# Patient Record
Sex: Male | Born: 1950
Health system: Southern US, Community
[De-identification: ages and names within clinical notes are randomized; demographics above are authoritative.]

## PROBLEM LIST (undated history)

## (undated) DIAGNOSIS — Z973 Presence of spectacles and contact lenses: Secondary | ICD-10-CM

## (undated) DIAGNOSIS — H9190 Unspecified hearing loss, unspecified ear: Secondary | ICD-10-CM

## (undated) DIAGNOSIS — K635 Polyp of colon: Secondary | ICD-10-CM

## (undated) DIAGNOSIS — N400 Enlarged prostate without lower urinary tract symptoms: Secondary | ICD-10-CM

## (undated) HISTORY — DX: Benign prostatic hyperplasia without lower urinary tract symptoms: N40.0

## (undated) HISTORY — DX: Unspecified hearing loss, unspecified ear: H91.90

## (undated) HISTORY — PX: POLYPECTOMY: SHX149

## (undated) HISTORY — DX: Presence of spectacles and contact lenses: Z97.3

## (undated) HISTORY — DX: Polyp of colon: K63.5

## (undated) HISTORY — PX: COLONOSCOPY: SHX174

## (undated) HISTORY — PX: APPENDECTOMY: SHX54

---

## 2003-09-19 ENCOUNTER — Ambulatory Visit (HOSPITAL_COMMUNITY): Admission: RE | Admit: 2003-09-19 | Discharge: 2003-09-19 | Payer: Self-pay | Admitting: Gastroenterology

## 2003-09-19 ENCOUNTER — Encounter (INDEPENDENT_AMBULATORY_CARE_PROVIDER_SITE_OTHER): Payer: Self-pay | Admitting: Specialist

## 2004-12-25 ENCOUNTER — Ambulatory Visit: Payer: Self-pay | Admitting: Internal Medicine

## 2006-08-27 ENCOUNTER — Ambulatory Visit: Payer: Self-pay | Admitting: Internal Medicine

## 2006-09-02 ENCOUNTER — Ambulatory Visit: Payer: Self-pay | Admitting: Internal Medicine

## 2007-06-16 ENCOUNTER — Ambulatory Visit: Payer: Self-pay | Admitting: Internal Medicine

## 2007-07-25 DIAGNOSIS — R229 Localized swelling, mass and lump, unspecified: Secondary | ICD-10-CM

## 2007-08-25 ENCOUNTER — Ambulatory Visit: Payer: Self-pay | Admitting: Family Medicine

## 2008-01-13 ENCOUNTER — Ambulatory Visit: Payer: Self-pay | Admitting: Internal Medicine

## 2008-01-28 ENCOUNTER — Ambulatory Visit: Payer: Self-pay | Admitting: Internal Medicine

## 2008-01-28 DIAGNOSIS — Z860100 Personal history of colon polyps, unspecified: Secondary | ICD-10-CM | POA: Insufficient documentation

## 2008-01-28 DIAGNOSIS — N138 Other obstructive and reflux uropathy: Secondary | ICD-10-CM

## 2008-01-28 DIAGNOSIS — N401 Enlarged prostate with lower urinary tract symptoms: Secondary | ICD-10-CM

## 2008-01-29 LAB — CONVERTED CEMR LAB
HDL: 45 mg/dL (ref >39.0)
Triglycerides: 89 mg/dL (ref 0–149)
VLDL: 18 mg/dL (ref 0–40)

## 2008-02-23 ENCOUNTER — Ambulatory Visit: Payer: Self-pay | Admitting: Internal Medicine

## 2008-03-14 ENCOUNTER — Encounter: Payer: Self-pay | Admitting: Internal Medicine

## 2008-03-14 ENCOUNTER — Ambulatory Visit: Payer: Self-pay | Admitting: Internal Medicine

## 2009-06-20 ENCOUNTER — Ambulatory Visit: Payer: Self-pay | Admitting: Family Medicine

## 2009-06-20 DIAGNOSIS — M65839 Other synovitis and tenosynovitis, unspecified forearm: Secondary | ICD-10-CM

## 2009-06-20 DIAGNOSIS — M67919 Unspecified disorder of synovium and tendon, unspecified shoulder: Secondary | ICD-10-CM | POA: Insufficient documentation

## 2009-06-20 DIAGNOSIS — M719 Bursopathy, unspecified: Secondary | ICD-10-CM

## 2009-06-20 DIAGNOSIS — M65849 Other synovitis and tenosynovitis, unspecified hand: Secondary | ICD-10-CM

## 2009-07-11 ENCOUNTER — Ambulatory Visit: Payer: Self-pay | Admitting: Family Medicine

## 2009-07-11 DIAGNOSIS — M2550 Pain in unspecified joint: Secondary | ICD-10-CM | POA: Insufficient documentation

## 2009-07-13 LAB — CONVERTED CEMR LAB: Uric Acid, Serum: 6.1 mg/dL (ref 4.0–7.8)

## 2009-07-14 ENCOUNTER — Encounter: Payer: Self-pay | Admitting: Family Medicine

## 2010-01-25 ENCOUNTER — Ambulatory Visit: Payer: Self-pay | Admitting: Family Medicine

## 2010-07-02 ENCOUNTER — Ambulatory Visit: Payer: Self-pay | Admitting: Internal Medicine

## 2010-07-02 DIAGNOSIS — R5381 Other malaise: Secondary | ICD-10-CM

## 2010-07-02 DIAGNOSIS — R5383 Other fatigue: Secondary | ICD-10-CM

## 2010-09-17 ENCOUNTER — Ambulatory Visit: Payer: Self-pay | Admitting: Internal Medicine

## 2010-09-17 DIAGNOSIS — R079 Chest pain, unspecified: Secondary | ICD-10-CM | POA: Insufficient documentation

## 2010-11-08 ENCOUNTER — Ambulatory Visit: Payer: Self-pay | Admitting: Family Medicine

## 2010-11-08 DIAGNOSIS — K4090 Unilateral inguinal hernia, without obstruction or gangrene, not specified as recurrent: Secondary | ICD-10-CM | POA: Insufficient documentation

## 2010-11-08 LAB — CONVERTED CEMR LAB
Basophils Relative: 0.6 % (ref 0.0–3.0)
Bilirubin Urine: NEGATIVE
Eosinophils Relative: 3.8 % (ref 0.0–5.0)
HCT: 45.6 % (ref 39.0–52.0)
Ketones, urine, test strip: NEGATIVE
MCV: 94 fL (ref 78.0–100.0)
Monocytes Absolute: 0.5 10*3/uL (ref 0.1–1.0)
Monocytes Relative: 8.1 % (ref 3.0–12.0)
Neutrophils Relative %: 60.6 % (ref 43.0–77.0)
Protein, U semiquant: NEGATIVE
RBC: 4.85 M/uL (ref 4.22–5.81)
Urobilinogen, UA: 0.2
WBC: 6.6 10*3/uL (ref 4.5–10.5)
pH: 6

## 2010-11-09 ENCOUNTER — Encounter: Payer: Self-pay | Admitting: Family Medicine

## 2010-11-16 ENCOUNTER — Encounter: Admission: RE | Admit: 2010-11-16 | Discharge: 2010-11-16 | Payer: Self-pay | Admitting: General Surgery

## 2010-12-21 ENCOUNTER — Ambulatory Visit (HOSPITAL_COMMUNITY)
Admission: RE | Admit: 2010-12-21 | Discharge: 2010-12-21 | Payer: Self-pay | Source: Home / Self Care | Attending: General Surgery | Admitting: General Surgery

## 2010-12-21 HISTORY — PX: INGUINAL HERNIA REPAIR: SUR1180

## 2011-01-04 ENCOUNTER — Encounter: Payer: Self-pay | Admitting: Internal Medicine

## 2011-01-20 LAB — CONVERTED CEMR LAB
ALT: 28 units/L (ref 0–53)
Alkaline Phosphatase: 71 units/L (ref 39–117)
Basophils Relative: 0.7 % (ref 0.0–3.0)
Bilirubin, Direct: 0.2 mg/dL (ref 0.0–0.3)
Calcium: 9.1 mg/dL (ref 8.4–10.5)
Eosinophils Relative: 3 % (ref 0.0–5.0)
Glucose, Bld: 78 mg/dL (ref 70–99)
Lymphocytes Relative: 22 % (ref 12.0–46.0)
Monocytes Relative: 7.9 % (ref 3.0–12.0)
Neutrophils Relative %: 66.4 % (ref 43.0–77.0)
Phosphorus: 3.6 mg/dL (ref 2.3–4.6)
Potassium: 4.6 meq/L (ref 3.5–5.1)
RBC: 4.89 M/uL (ref 4.22–5.81)
Sodium: 143 meq/L (ref 135–145)
Total Protein: 6.8 g/dL (ref 6.0–8.3)
WBC: 10.4 10*3/uL (ref 4.5–10.5)

## 2011-01-22 NOTE — Assessment & Plan Note (Signed)
Summary: cough.congestion/rbh   Vital Signs:  Patient profile:   60 year old male Weight:      212 pounds O2 Sat:      98 % on Room air Temp:     97.9 degrees F oral Pulse rate:   88 / minute Pulse rhythm:   regular BP sitting:   114 / 70  (left arm) Cuff size:   large  Vitals Entered By: Sydell Axon LPN (January 25, 2010 3:54 PM)  O2 Flow:  Room air CC: Cough   History of Present Illness: Pt has cold for one month since the New Year, with cough that has still hung on. He has had cough every day since the New Year. He has no fever or chills, no headache but did initially, no ear pain now, had initially, rhinitis not now, no ST, but cough rarely minimal production basically clear, sometimes green. Some SOB with breathing depp, no N/V. During cold took Theraflu for a week. He has h/o asbestos exposure for 20 yrs but has no resp compromise when seen.  Problems Prior to Update: 1)  Arthralgia  (ICD-719.40) 2)  Rotator Cuff Syndrome, Left  (ICD-726.10) 3)  Other Tenosynovitis of Hand and Wrist  (ICD-727.05) 4)  Preventive Health Care  (ICD-V70.0) 5)  Colonic Polyps, Hx of  (ICD-V12.72) 6)  Colonic Polyps, Adenomatous, Hx of  (ICD-V12.72) 7)  Benign Prostatic Hypertrophy  (ICD-600.00) 8)  Symp Swell/mass/lump, Localized Superficial  (ICD-782.2)  Medications Prior to Update: 1)  None  Allergies: No Known Drug Allergies  Physical Exam  General:  Well-developed,well-nourished,in no acute distress; alert,appropriate and cooperative throughout examination, coughing occas when seen, minimally congested. Head:  Normocephalic and atraumatic without obvious abnormalities. No apparent alopecia or balding. Sinuses NT. Eyes:  Conjunctiva clear bilaterally.  Ears:  External ear exam shows no significant lesions or deformities.  Otoscopic examination reveals clear canals, tympanic membranes are intact bilaterally without bulging, retraction, inflammation or discharge. Hearing is grossly  normal bilaterally. Mildly decreased hearing acuity. Nose:  External nasal examination shows no deformity or inflammation. Nasal mucosa are pink and moist without lesions or exudates. Mouth:  Oral mucosa and oropharynx without lesions or exudates.  Teeth in good repair. Neck:  No deformities, masses, or tenderness noted. Lungs:  Normal respiratory effort, chest expands symmetrically. Lungs are clear to auscultation, no crackles or wheezes. Heart:  Normal rate and regular rhythm. S1 and S2 normal without gallop, murmur, click, rub or other extra sounds.   Impression & Recommendations:  Problem # 1:  BRONCHITIS- ACUTE (ICD-466.0) Assessment New  See instructions. Advair sample given for inflammation. If doesn't improve, consider Pulm consult for asbestos exposure. His updated medication list for this problem includes:    Zithromax Z-pak 250 Mg Tabs (Azithromycin) .Marland Kitchen... As dir    Tessalon 200 Mg Caps (Benzonatate) ..... One tab by mouth three times a day  Take antibiotics and other medications as directed. Encouraged to push clear liquids, get enough rest, and take acetaminophen as needed. To be seen in 5-7 days if no improvement, sooner if worse.  Complete Medication List: 1)  Zithromax Z-pak 250 Mg Tabs (Azithromycin) .... As dir 2)  Tessalon 200 Mg Caps (Benzonatate) .... One tab by mouth three times a day  Patient Instructions: 1)  Take Zithro 2)  Use Tessalon 3)  Take Guaifenesin by [facility] or [facility] and getting MUCOUS RELIEF EXPECTORANT (400mg ), take 11/2 tabs by mouth AM and NOON. 4)  Drink  lots of fluids anytime taking Guaifenesin.  5)  Take Tyl ES 2 tabs by mouth three times a day  6)  Use Advair two times a day for one week 7)  If no impr, RTC for poss asbestos eval. Prescriptions: TESSALON 200 MG CAPS (BENZONATATE) one tab by mouth three times a day  #40 x 0   Entered and Authorized by:   Shaune Leeks MD   Signed by:   Shaune Leeks MD on 01/25/2010   Method used:   [facility] to        [facility]  [address] (retail)       [address]       [place], [place]  [address]       Ph: [phone] or [phone]       Fax: [phone]   RxID:   [id number] ZITHROMAX Z-PAK 250 MG TABS (AZITHROMYCIN) as dir  #1 pak x 0   Entered and Authorized by:   Shaune Leeks MD   Signed by:   Shaune Leeks MD on 01/25/2010   Method used:   [facility]        [facility]  [address] (retail)       [address]       [place], [place]  [address]       Ph: [phone] or [phone]       Fax: [phone]   RxID:   [id number]   Prior Medications (reviewed today): None Current Allergies (reviewed today): No known allergies

## 2011-01-22 NOTE — Assessment & Plan Note (Signed)
Summary: 2:15 NO ENERGY/CLE   Vital Signs:  Patient profile:   60 year old male Weight:      206 pounds BMI:     28.04 Temp:     98.1 degrees F oral Pulse rate:   76 / minute Pulse rhythm:   regular BP sitting:   110 / 70  (left arm) Cuff size:   large  Vitals Entered By: Mervin Hack CMA Duncan Dull) (July 02, 2010 2:30 PM) CC: no energy   History of Present Illness: Having a difficult time over the past 2-3 weeks No energy will come home from work and just want to go to sleep Appetite is off---better than last week though some transient sore throat and had cold sore (this is a recurrent thing)  No cough No SOB No rash or sores now (did have briefly sore toe) No nausea or vomiting No diarrhea no otalgia    Allergies: No Known Drug Allergies  Past History:  Past medical, surgical, family and social histories (including risk factors) reviewed for relevance to current acute and chronic problems.  Past Medical History: Reviewed history from 01/28/2008 and no changes required. Benign prostatic hypertrophy Colonic polyps, hx of--adenomatous  Past Surgical History: Reviewed history from 01/13/2008 and no changes required. Appendectomy as child  Family History: Reviewed history from 01/13/2008 and no changes required. Dad died @76  had DM, not sure of cause of death Mom 62 now--has arthritis 4 brothers, 1 sister DM in 1 brother No CAD but 1 brother has valvular heart disease  No HTN No colon or prostate cancer (except maybe someone distant)  Social History: Reviewed history from 01/28/2008 and no changes required. Occupation: [employer]--various jobs Married--2 children Never Smoked Alcohol use-occ Regular exercise-no  Review of Systems       chronic tinnitus---some worsening hearing is not changed Doesn't note much weight loss--down 6# since February  Physical Exam  General:  alert and normal appearance.   Ears:  R ear normal and L ear normal.     Mouth:  no erythema, no exudates, and no lesions.   Neck:  supple, no masses, no thyromegaly, no carotid bruits, and no cervical lymphadenopathy.   Lungs:  normal respiratory effort, no intercostal retractions, no accessory muscle use, normal breath sounds, and no crackles.   Heart:  normal rate, regular rhythm, no murmur, and no gallop.  Coarse squeaky rub heard intermitently at various spots across precordium and even in right back Abdomen:  soft, non-tender, no masses, no hepatomegaly, and no splenomegaly.   Msk:  no joint tenderness and no joint swelling.   Extremities:  no edema Skin:  no rashes and no suspicious lesions.   Psych:  normally interactive, good eye contact, not anxious appearing, and not depressed appearing.     Impression & Recommendations:  Problem # 1:  FATIGUE (ICD-780.79) Assessment New  non specific may have low level infection but nothing clear cut will check labs observe   Has apparent rub this could go along with acute viral syndrome no signs of CHF or cardiac symptoms would just check CPK and sed rate, consider echo if abnormal  Orders: EKG w/ Interpretation (93000) TLB-Renal Function Panel (80069-RENAL) TLB-CBC Platelet - w/Differential (85025-CBCD) TLB-Hepatic/Liver Function Pnl (80076-HEPATIC) TLB-TSH (Thyroid Stimulating Hormone) (84443-TSH) Venipuncture (54098) TLB-CK Total Only(Creatine Kinase/CPK) (82550-CK) TLB-CK-MB (Creatine Kinase MB) (82553-CKMB) TLB-Sedimentation Rate (ESR) (85652-ESR)  Other Orders: TLB-PSA (Prostate Specific Antigen) (84153-PSA)  Patient Instructions: 1)  Please call for reevaluation in 2-3 weeks if not  feeling better 2)  Otherwise schediule a physical in about 1 year  Prior Medications: Current Allergies (reviewed today): No known allergies    EKG  Procedure date:  07/02/2010  Findings:      sinus @75  normal

## 2011-01-22 NOTE — Assessment & Plan Note (Signed)
Summary: SWOLLEN AREA ON CHEST   Vital Signs:  Patient profile:   60 year old male Weight:      208 pounds Temp:     98.2 degrees F oral Pulse rate:   76 / minute Pulse rhythm:   regular BP sitting:   110 / 70  (left arm) Cuff size:   large  Vitals Entered By: Sydell Axon LPN (September 17, 2010 2:50 PM) CC: Has a swollen area on left side of chest that is tender   History of Present Illness: Not having the fatigue as much now  Now has tender, sore mass over lateral left breast Goes back perhaps 2 months Progressively increasing soreness but not necessarily growing  No discharge from mass or nipple No apparent axillary nodes no fever  No injury in this area hasn't tried any meds for this Not much of a caffeine drinker  Allergies: No Known Drug Allergies  Past History:  Past medical, surgical, family and social histories (including risk factors) reviewed for relevance to current acute and chronic problems.  Past Medical History: Reviewed history from 01/28/2008 and no changes required. Benign prostatic hypertrophy Colonic polyps, hx of--adenomatous  Past Surgical History: Reviewed history from 01/13/2008 and no changes required. Appendectomy as child  Family History: Reviewed history from 01/13/2008 and no changes required. Dad died @76  had DM, not sure of cause of death Mom 29 now--has arthritis 4 brothers, 1 sister DM in 1 brother No CAD but 1 brother has valvular heart disease  No HTN No colon or prostate cancer (except maybe someone distant)  Social History: Reviewed history from 01/28/2008 and no changes required. Occupation: [employer]--various jobs Married--2 children Never Smoked Alcohol use-occ Regular exercise-no  Review of Systems       No cough  No SOB  Physical Exam  General:  alert and normal appearance.   Breasts:  ? slight fullness  ~4 o'clock in left breast no nipple abnormalities or discharge no inflammation slightly  tender over this area Axillary Nodes:  No palpable lymphadenopathy   Impression & Recommendations:  Problem # 1:  CHEST PAIN (ICD-786.50) Assessment New vague palpable area that could be cystic changes nothing worrisome but he is concerned about the continued tenderness  will try ibuprofen surgical eval if persists  Orders: T-2 View CXR (71020TC)  Patient Instructions: 1)  Please try ibuprofen 400-600mg  three times a day with food to see if that gets rid of the pain (take 2-3 of the over the counter 200mg  tabs) 2)  If the pain doesn't resolve in the next few weeks, call for referral to surgeon for reevaluation  Prior Medications (reviewed today): None Current Allergies (reviewed today): No known allergies

## 2011-01-22 NOTE — Assessment & Plan Note (Signed)
Summary: BACK PAIN/CLE   Vital Signs:  Patient profile:   60 year old male Weight:      212.25 pounds Temp:     97.7 degrees F oral Pulse rate:   88 / minute Pulse rhythm:   regular BP sitting:   118 / 78  (left arm) Cuff size:   large  Vitals Entered By: Selena Batten Dance CMA Duncan Dull) (November 08, 2010 8:34 AM) CC: Flank and groin pain   History of Present Illness: CC: L abd/back pain  2d h/o lower abd pain and L flank pain.  When starts, sudden sharp stabbing pain, has to lay flat on abdomen.  Pain does stay L groin area along inguinal canal, also with L lower back/flank pain.  Improved in fetal position immobile.  Worse when changing positions.  Severe pain episode lasts 30 min.  Feeling some discomfort now but nothing like acute episodes of pain.  No fevers/chills, no R sided abd pain, dysuria, urgency, no blood in stool or urine.  Appetite intact.  Nl bowel movements, passing gas fine.    Colonoscopy 2 years ago, normal.  + h/o L inguinal hernia.  Current Medications (verified): 1)  None  Allergies (verified): No Known Drug Allergies  Past History:  Past Medical History: Last updated: 01/28/2008 Benign prostatic hypertrophy Colonic polyps, hx of--adenomatous  Past Surgical History: Last updated: February 08, 2008 Appendectomy as child  Family History: Last updated: 2008/02/08 Dad died @76  had DM, not sure of cause of death Mom 37 now--has arthritis 4 brothers, 1 sister DM in 1 brother No CAD but 1 brother has valvular heart disease  No HTN No colon or prostate cancer (except maybe someone distant)  Social History: Last updated: 01/28/2008 Occupation: [employer]--various jobs Married--2 children Never Smoked Alcohol use-occ Regular exercise-no PMH-FH-SH reviewed for relevance  Review of Systems       per HPI  Physical Exam  General:  alert and normal appearance.  uncomfortable with getting on and off exam table but otherwise NAD.  nontoxic Lungs:  normal  respiratory effort, no intercostal retractions, no accessory muscle use, normal breath sounds, and no crackles.   Heart:  normal rate, regular rhythm, no murmur, and no gallop.  Abdomen:  soft, non-tender,  no hepatomegaly, and no splenomegaly.  no rebound or guarding.  + L inguinal mobile mass superficial to inguinal canal, not really reducible Genitalia:  Testes bilaterally descended without nodularity, tenderness or masses. No scrotal masses or lesions. No penis lesions or urethral discharge.  + L inguinal hernia Pulses:  2+ rad pulses Extremities:  no edema Skin:  Intact without suspicious lesions or rashes   Impression & Recommendations:  Problem # 1:  INGUINAL HERNIA, LEFT (ICD-550.90) healthy 59yo with episodes of L inguinal hernia pain, will refer for surgical eval.  doesn't look strangulated, ? incarcerated causing pain.  UA WNL x spgr >1.030, rec increased fluid.  check CBC.  Cr 06/2010 0.9.  Orders: Surgical Referral (Surgery) TLB-CBC Platelet - w/Differential (85025-CBCD)  Problem # 2:  SYMP SWELL/MASS/LUMP, LOCALIZED SUPERFICIAL (ICD-782.2) also describes L chest mass thought to be cystic changes, PCP had considered surgical eval.  pt desires to have #1 taken care of first.  Patient Instructions: 1)  Drink more water. 2)  We will refer you to the surgery clinic for evaluation.  Pass by Marion's office for appointment. 3)  Blood work today. 4)  Good to meet you today, call clinic with questions.   Orders Added: 1)  Est. Patient Level  III [id number] 2)  Surgical Referral [Surgery] 3)  TLB-CBC Platelet - w/Differential [85025-CBCD]    Prior Medications: None Current Allergies (reviewed today): No known allergies   Laboratory Results   Urine Tests  Date/Time Received: November 08, 2010 8:39 AM  Date/Time Reported: November 08, 2010 8:39 AM   Routine Urinalysis   Color: yellow Appearance: Clear Glucose: negative   (Normal Range: Negative) Bilirubin: negative    (Normal Range: Negative) Ketone: negative   (Normal Range: Negative) Spec. Gravity: >=1.030   (Normal Range: 1.003-1.035) Blood: negative   (Normal Range: Negative) pH: 6.0   (Normal Range: 5.0-8.0) Protein: negative   (Normal Range: Negative) Urobilinogen: 0.2   (Normal Range: 0-1) Nitrite: negative   (Normal Range: Negative) Leukocyte Esterace: negative   (Normal Range: Negative)       Appended Document: BACK PAIN/CLE please call patient and notify that if sharp pain episodes not resolving after 30 min, or if any fevers/chills, will need to go to ER prior to appt tomorrow.  o/w can wait until tomorrow.  Appended Document: BACK PAIN/CLE Patient notified and verbalized understanding

## 2011-01-24 NOTE — Consult Note (Signed)
Summary: [facility]   Imported By: Lanelle Bal 12/04/2010 14:00:23  _____________________________________________________________________  External Attachment:    Type:   Image     Comment:   External Document  Appended Document: [facility] Planning hernia repair will check chest lesion with ultrasound. If lipoma, may remove at the same time in the OR

## 2011-01-30 NOTE — Letter (Signed)
Summary: Dr Andrey Campanile Note  Dr Andrey Campanile Note   Imported By: Kassie Mends 01/25/2011 11:04:35  _____________________________________________________________________  External Attachment:    Type:   Image     Comment:   External Document  Appended Document: Dr Andrey Campanile Note doing well after left inguinal hernia repair

## 2011-02-06 ENCOUNTER — Encounter: Payer: Self-pay | Admitting: Internal Medicine

## 2011-02-19 NOTE — Letter (Signed)
Summary: [facility]   Imported By: Maryln Gottron 02/15/2011 15:44:25  _____________________________________________________________________  External Attachment:    Type:   Image     Comment:   External Document  Appended Document: [facility] post op left direct inguinal hernia repair in [place] Released from care

## 2011-03-04 LAB — BASIC METABOLIC PANEL
CO2: 30 mEq/L (ref 19–32)
Calcium: 9.1 mg/dL (ref 8.4–10.5)
Chloride: 105 mEq/L (ref 96–112)
Glucose, Bld: 119 mg/dL — ABNORMAL HIGH (ref 70–99)
Sodium: 140 mEq/L (ref 135–145)

## 2011-03-04 LAB — DIFFERENTIAL
Basophils Relative: 1 % (ref 0–1)
Eosinophils Absolute: 0.2 10*3/uL (ref 0.0–0.7)
Eosinophils Relative: 4 % (ref 0–5)
Lymphs Abs: 1.8 10*3/uL (ref 0.7–4.0)
Monocytes Absolute: 0.7 10*3/uL (ref 0.1–1.0)
Monocytes Relative: 10 % (ref 3–12)
Neutrophils Relative %: 59 % (ref 43–77)

## 2011-03-04 LAB — CBC
HCT: 45.8 % (ref 39.0–52.0)
Hemoglobin: 16 g/dL (ref 13.0–17.0)
MCH: 31.4 pg (ref 26.0–34.0)
MCHC: 34.9 g/dL (ref 30.0–36.0)
MCV: 89.8 fL (ref 78.0–100.0)

## 2011-05-10 NOTE — Op Note (Signed)
   NAME:  Louis Anderson, Louis Anderson                         ACCOUNT NO.:  [id number]   MEDICAL RECORD NO.:  [id number]                   PATIENT TYPE:  AMB   LOCATION:  ENDO                                 FACILITY:  [facility]   PHYSICIAN:  Graylin Shiver, M.D.                DATE OF BIRTH:  1951/07/05   DATE OF PROCEDURE:  09/19/2003  DATE OF DISCHARGE:                                 OPERATIVE REPORT   PROCEDURE PERFORMED:  Colonoscopy with biopsy.   INDICATIONS FOR PROCEDURE:  Screening.   Informed consent was obtained after explanation of the risks of bleeding,  infection, and perforation.   PREMEDICATIONS:  Fentanyl 75 mcg  IV, Versed 6 mg IV.   DESCRIPTION OF PROCEDURE:  With the patient in the left lateral decubitus  position, a rectal exam was performed and no masses were felt.  The Olympus  colonoscope was inserted into the rectum and advanced around the colon to  the cecum.  Cecal landmarks were identified.  In the cecum, there was a  small 3 mm sessile polyp removed with cold forceps.  In the ascending colon,  there was a small 4 mm sessile polyp removed with cold forceps.  The  transverse colon looked normal.  The descending colon, sigmoid and rectum  looked normal.  The patient tolerated the procedure well without  complications.   IMPRESSION:  Two small colon polyps.   PLAN:  The pathology will be checked.                                               Graylin Shiver, M.D.    SFG/MEDQ  D:  09/19/2003  T:  09/19/2003  Job:  [id number]   cc:   Christella Noa, M.D.  [address]  [place], [place] [address]  Fax: [phone]

## 2011-05-10 NOTE — Assessment & Plan Note (Signed)
[facility] OFFICE NOTE   NAME:Louis Anderson, Louis Anderson                      MRN:          [id number]  DATE:09/02/2006                            DOB:          05/01/1951    A 60 year old gentleman come in today for a wellness exam.  He has a history  of chronic tinnitus, also colonic polyps.  Last colonoscopy was 4-years ago.  He has also had a remote appendectomy.   REVIEW OF SYSTEMS:  __________ fairly unremarkable.  Does complain of some  occasional right lower back pain.   FAMILY HISTORY:  Positive for diabetes, otherwise non-contributory.   EXAMINATION:  GENERAL:  Revealed a healthy appearing male in no acute  distress.  VITAL SIGNS:  Blood pressure was normal.  HEENT:  Fundi, ear, nose and throat clear.  NECK:  No bruits or adenopathies.  CHEST:  Was clear.  CARDIOVASCULAR:  Normal heart sounds, no murmurs.  ABDOMEN: Benign.  No organomegaly.  EXTERNAL GENITALIA:  Normal.  EXTREMITIES:  Negative with full peripheral pulses.  RECTAL:  Prostate revealed +2 enlargement.  Hematest negative stool.  NEUROLOGICAL:  Negative.   IMPRESSION:  1. Chronic polyps.  2. Chronic tinnitus.  3. Episodic low back pain.   DISPOSITION:  He will take Advil as needed.  He does complain of some  decreased libido.  Some erectile dysfunction.  A  serum testosterone level  will be check.  He was also given a sample and a prescription for Viagra.  Exercises for his low back pain also dispensed.                                   Gordy Savers, MD   PFK/MedQ  DD:  09/02/2006  DT:  09/03/2006  Job #:  [id number]

## 2011-06-04 ENCOUNTER — Encounter (INDEPENDENT_AMBULATORY_CARE_PROVIDER_SITE_OTHER): Payer: Self-pay | Admitting: General Surgery

## 2011-07-18 ENCOUNTER — Encounter: Payer: Self-pay | Admitting: Family Medicine

## 2011-07-18 ENCOUNTER — Ambulatory Visit (INDEPENDENT_AMBULATORY_CARE_PROVIDER_SITE_OTHER): Payer: BC Managed Care – PPO | Admitting: Family Medicine

## 2011-07-18 VITALS — BP 112/76 | HR 76 | Temp 97.9°F | Wt 207.2 lb

## 2011-07-18 DIAGNOSIS — R109 Unspecified abdominal pain: Secondary | ICD-10-CM

## 2011-07-18 DIAGNOSIS — R350 Frequency of micturition: Secondary | ICD-10-CM

## 2011-07-18 DIAGNOSIS — R1030 Lower abdominal pain, unspecified: Secondary | ICD-10-CM

## 2011-07-18 LAB — POCT URINALYSIS DIPSTICK
Bilirubin, UA: NEGATIVE
Glucose, UA: NEGATIVE
Ketones, UA: NEGATIVE
pH, UA: 7.5

## 2011-07-18 MED ORDER — NAPROXEN 500 MG PO TABS: ORAL_TABLET | ORAL | Status: DC

## 2011-07-18 NOTE — Patient Instructions (Addendum)
Could be irritation after hernia repair from mesh. Try to treat with anti inflammatory twice daily for next several days with food, watch out for stomach upset. If not better, check with surgery about pain. If not improving or any worsening, or fevers, diarrhea, blood please return to be seen.

## 2011-07-18 NOTE — Progress Notes (Signed)
  Subjective:    Patient ID: Louis Anderson, male    DOB: March 27, 1951, 60 y.o.   MRN: [id number]  HPI CC: lower abd pain  Last several months, lower abd pain, worse when laying supine on bed.  More gassy than normal.  Pain not associated with foods.  Constant on left side, feels sharp occasionally sometimes feels left sided pressure at work.  Pain seems to be bothering him a bit more since after surgery, sharp inguinal hernia pain has not returned.  H/o back pain, occasional back pain but not changed with current abd pain.  Has been drinking plenty of water.  Urine stays dark.  Would like checked.  Denies dysuria, polyuria, urgency.  Nocturia x 3 times, weaker stream.    Had open LIH repair with mesh 11/2010.    Had colonoscopy a few years ago, told ok.  Denies fevers/chills, n/v/d/c, no blood in urine or stool.  No recent food changes.  Review of Systems Per HPI    Objective:   Physical Exam  Nursing note and vitals reviewed. Constitutional: He appears well-developed and well-nourished. No distress.  HENT:  Head: Normocephalic and atraumatic.  Mouth/Throat: Oropharynx is clear and moist. No oropharyngeal exudate.  Eyes: Conjunctivae and EOM are normal. Pupils are equal, round, and reactive to light. No scleral icterus.  Neck: Normal range of motion. Neck supple.  Cardiovascular: Normal rate, regular rhythm, normal heart sounds and intact distal pulses.   No murmur heard. Pulmonary/Chest: Effort normal and breath sounds normal. No respiratory distress. He has no wheezes. He has no rales.  Abdominal: Soft. Bowel sounds are normal. He exhibits no distension. There is no hepatosplenomegaly. There is tenderness (minimal lower abd). There is no rebound and no guarding. Hernia confirmed negative in the ventral area, confirmed negative in the right inguinal area and confirmed negative in the left inguinal area.  Musculoskeletal: He exhibits no edema.  Skin: Skin is warm and dry. No rash  noted.  Psychiatric: He has a normal mood and affect.          Assessment & Plan:

## 2011-07-18 NOTE — Assessment & Plan Note (Signed)
UA with trace blood, LE. Micro WNL. Exam benign today. ? Mesh causing discomfort.  No evidence of recurrent hernia on exam today. Trial of NSAIDs.  If not improved, rec check with surgery.

## 2011-08-14 ENCOUNTER — Encounter (INDEPENDENT_AMBULATORY_CARE_PROVIDER_SITE_OTHER): Payer: Self-pay | Admitting: General Surgery

## 2011-09-06 ENCOUNTER — Encounter (INDEPENDENT_AMBULATORY_CARE_PROVIDER_SITE_OTHER): Payer: Self-pay | Admitting: General Surgery

## 2011-09-27 ENCOUNTER — Encounter: Payer: Self-pay | Admitting: Internal Medicine

## 2011-10-03 ENCOUNTER — Ambulatory Visit (INDEPENDENT_AMBULATORY_CARE_PROVIDER_SITE_OTHER): Payer: BC Managed Care – PPO | Admitting: Internal Medicine

## 2011-10-03 ENCOUNTER — Encounter: Payer: Self-pay | Admitting: Internal Medicine

## 2011-10-03 VITALS — BP 121/65 | HR 65 | Temp 98.0°F | Ht 71.0 in | Wt 206.0 lb

## 2011-10-03 DIAGNOSIS — Z23 Encounter for immunization: Secondary | ICD-10-CM

## 2011-10-03 DIAGNOSIS — R1030 Lower abdominal pain, unspecified: Secondary | ICD-10-CM

## 2011-10-03 DIAGNOSIS — Z0000 Encounter for general adult medical examination without abnormal findings: Secondary | ICD-10-CM | POA: Insufficient documentation

## 2011-10-03 DIAGNOSIS — Z2911 Encounter for prophylactic immunotherapy for respiratory syncytial virus (RSV): Secondary | ICD-10-CM

## 2011-10-03 DIAGNOSIS — N400 Enlarged prostate without lower urinary tract symptoms: Secondary | ICD-10-CM

## 2011-10-03 DIAGNOSIS — R109 Unspecified abdominal pain: Secondary | ICD-10-CM

## 2011-10-03 MED ORDER — CIPROFLOXACIN HCL 250 MG PO TABS: 250.0000 mg | ORAL_TABLET | Freq: Two times a day (BID) | ORAL | Status: AC

## 2011-10-03 NOTE — Progress Notes (Signed)
Subjective:    Patient ID: Louis Anderson, male    DOB: February 06, 1951, 60 y.o.   MRN: [id number]  HPI Still has some discomfort in lower abdomen--since hernia repair Just pressure sensation---not overly bad Did have reeval from surgeon---no explanation  Has urinary frequency Nocturia x 3-4 No dysuria No hematuria  Has plantar fasciitis Got fancy shoes with arch support---just a week ago  No current outpatient prescriptions on file prior to visit.    No Known Allergies  Past Medical History  Diagnosis Date  . Bronchitis   . Colon polyp   . Wears glasses   . BPH (benign prostatic hypertrophy)     Past Surgical History  Procedure Date  . Appendectomy   . Inguinal hernia repair 12/21/10    left    Family History  Problem Relation Age of Onset  . Diabetes Father     History   Social History  . Marital Status: Married    Spouse Name: N/A    Number of Children: 2  . Years of Education: N/A   Occupational History  . [employer]    Social History Main Topics  . Smoking status: Never Smoker   . Smokeless tobacco: Never Used  . Alcohol Use: 3.5 oz/week    7 drink(s) per week     glass of wine a day  . Drug Use: No  . Sexually Active: Not on file   Other Topics Concern  . Not on file   Social History Narrative  . No narrative on file   Review of Systems  Constitutional: Negative for fatigue and unexpected weight change.       Wears seat belt  HENT: Positive for hearing loss and tinnitus. Negative for congestion, rhinorrhea and dental problem.        Regular with dentist  Eyes: Negative for visual disturbance.       No diplopia or unilateral vision loss Does have floaters---keeps up with ophtho  Respiratory: Negative for cough, chest tightness and shortness of breath.   Cardiovascular: Positive for palpitations. Negative for chest pain and leg swelling.       Rare brief racing heart--not overly fast  Gastrointestinal: Negative for nausea, vomiting,  abdominal pain, constipation and blood in stool.       Rare heartburn  Genitourinary: Positive for difficulty urinating. Negative for dysuria and hematuria.       Some ED--may be interested in meds Nocturia is bothersome  Musculoskeletal: Positive for back pain. Negative for joint swelling and arthralgias.       Chiropractor helps occ back pain  Skin: Negative for rash.       No suspicious lesions  Neurological: Negative for dizziness, syncope, weakness, light-headedness and numbness.       Occ headaches---right parietal esp with cold weather. Nothing new  Hematological: Negative for adenopathy. Does not bruise/bleed easily.  Psychiatric/Behavioral: Negative for sleep disturbance and dysphoric mood. The patient is not nervous/anxious.        Objective:   Physical Exam  Constitutional: He appears well-developed and well-nourished. No distress.  HENT:  Head: Normocephalic and atraumatic.  Right Ear: External ear normal.  Left Ear: External ear normal.  Mouth/Throat: Oropharynx is clear and moist. No oropharyngeal exudate.       TMs normal  Eyes: Conjunctivae and EOM are normal. Pupils are equal, round, and reactive to light.       Fundi benign  Neck: Normal range of motion. Neck supple.  Cardiovascular: Normal rate,  regular rhythm, normal heart sounds and intact distal pulses.  Exam reveals no gallop.   No murmur heard. Pulmonary/Chest: Effort normal and breath sounds normal. No respiratory distress. He has no wheezes. He has no rales.  Abdominal: Soft.       Slight vague lower abd sensitivity  Genitourinary:       Prostate not sig enlarged. Maintained sulcus but mild to moderate tenderness  Musculoskeletal: Normal range of motion. He exhibits no edema and no tenderness.  Lymphadenopathy:    He has no cervical adenopathy.  Skin: Skin is warm. No rash noted.  Psychiatric: He has a normal mood and affect. His behavior is normal. Judgment and thought content normal.            Assessment & Plan:

## 2011-10-03 NOTE — Assessment & Plan Note (Signed)
May be cause of his symptoms of nocturia No meds for this till after antibiotic course

## 2011-10-03 NOTE — Assessment & Plan Note (Signed)
Has persisted since hernia surgery Has mildly tender prostate Will treat with cipro for 1 month

## 2011-10-03 NOTE — Assessment & Plan Note (Signed)
Generally healthy Discussed fitness Tdap and zostavax today Prefers no flu vaccine No PSA due to current prostatitis

## 2011-10-04 LAB — CBC WITH DIFFERENTIAL/PLATELET
Eosinophils Relative: 3.4 % (ref 0.0–5.0)
HCT: 48.5 % (ref 39.0–52.0)
Hemoglobin: 16.3 g/dL (ref 13.0–17.0)
Lymphs Abs: 2.3 10*3/uL (ref 0.7–4.0)
MCV: 92.9 fl (ref 78.0–100.0)
Monocytes Absolute: 0.7 10*3/uL (ref 0.1–1.0)
Monocytes Relative: 7.7 % (ref 3.0–12.0)
Neutro Abs: 5.8 10*3/uL (ref 1.4–7.7)
Platelets: 238 10*3/uL (ref 150.0–400.0)
RDW: 13.9 % (ref 11.5–14.6)
WBC: 9.1 10*3/uL (ref 4.5–10.5)

## 2011-10-04 LAB — BASIC METABOLIC PANEL
BUN: 19 mg/dL (ref 6–23)
Chloride: 103 mEq/L (ref 96–112)
GFR: 76.72 mL/min (ref >60.00)
Glucose, Bld: 89 mg/dL (ref 70–99)
Potassium: 4.9 mEq/L (ref 3.5–5.1)
Sodium: 140 mEq/L (ref 135–145)

## 2011-10-04 LAB — HEPATIC FUNCTION PANEL
ALT: 25 U/L (ref 0–53)
AST: 24 U/L (ref 0–37)
Albumin: 4.7 g/dL (ref 3.5–5.2)
Total Bilirubin: 0.9 mg/dL (ref 0.3–1.2)

## 2012-01-26 IMAGING — CR DG CHEST 2V
2 series · 2 of 2 positions shown · non-contrast
Comparison: None.

CLINICAL DATA: Chest pain

CHEST - 2 VIEW

[view not recorded (1 of 2)]
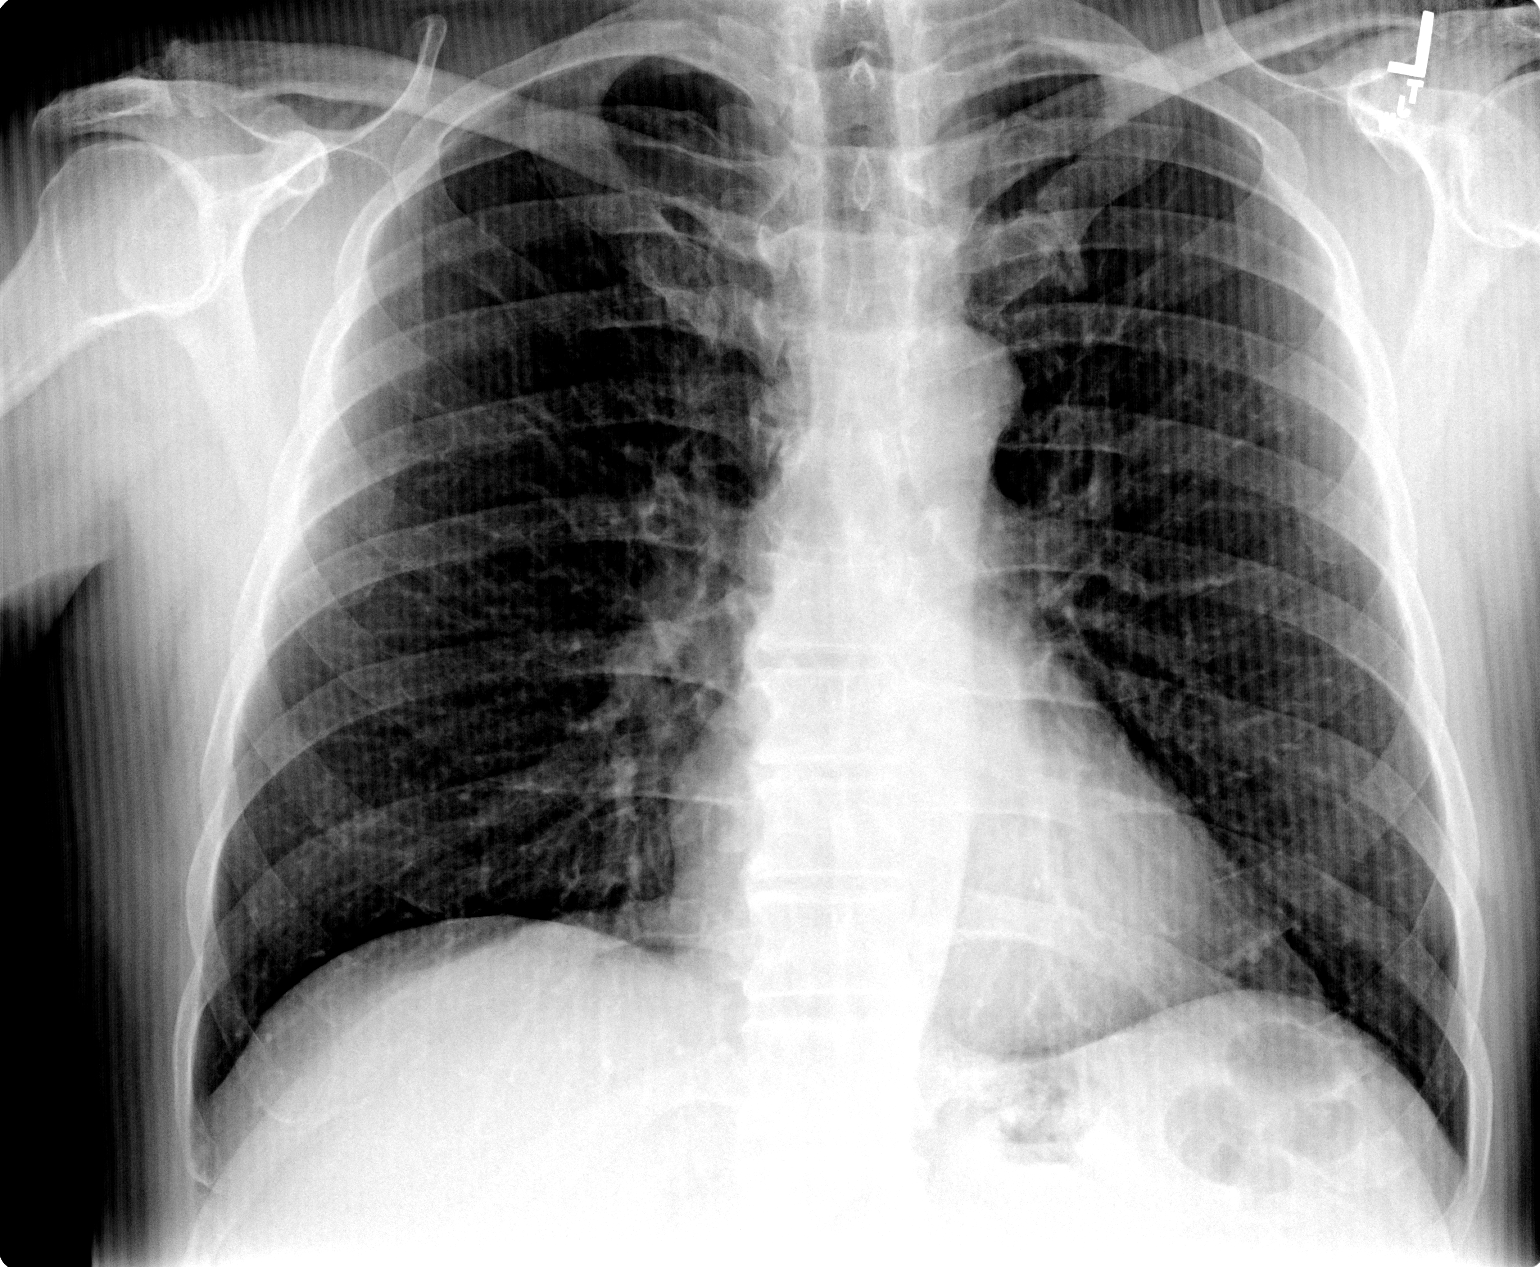

[view not recorded (2 of 2)]
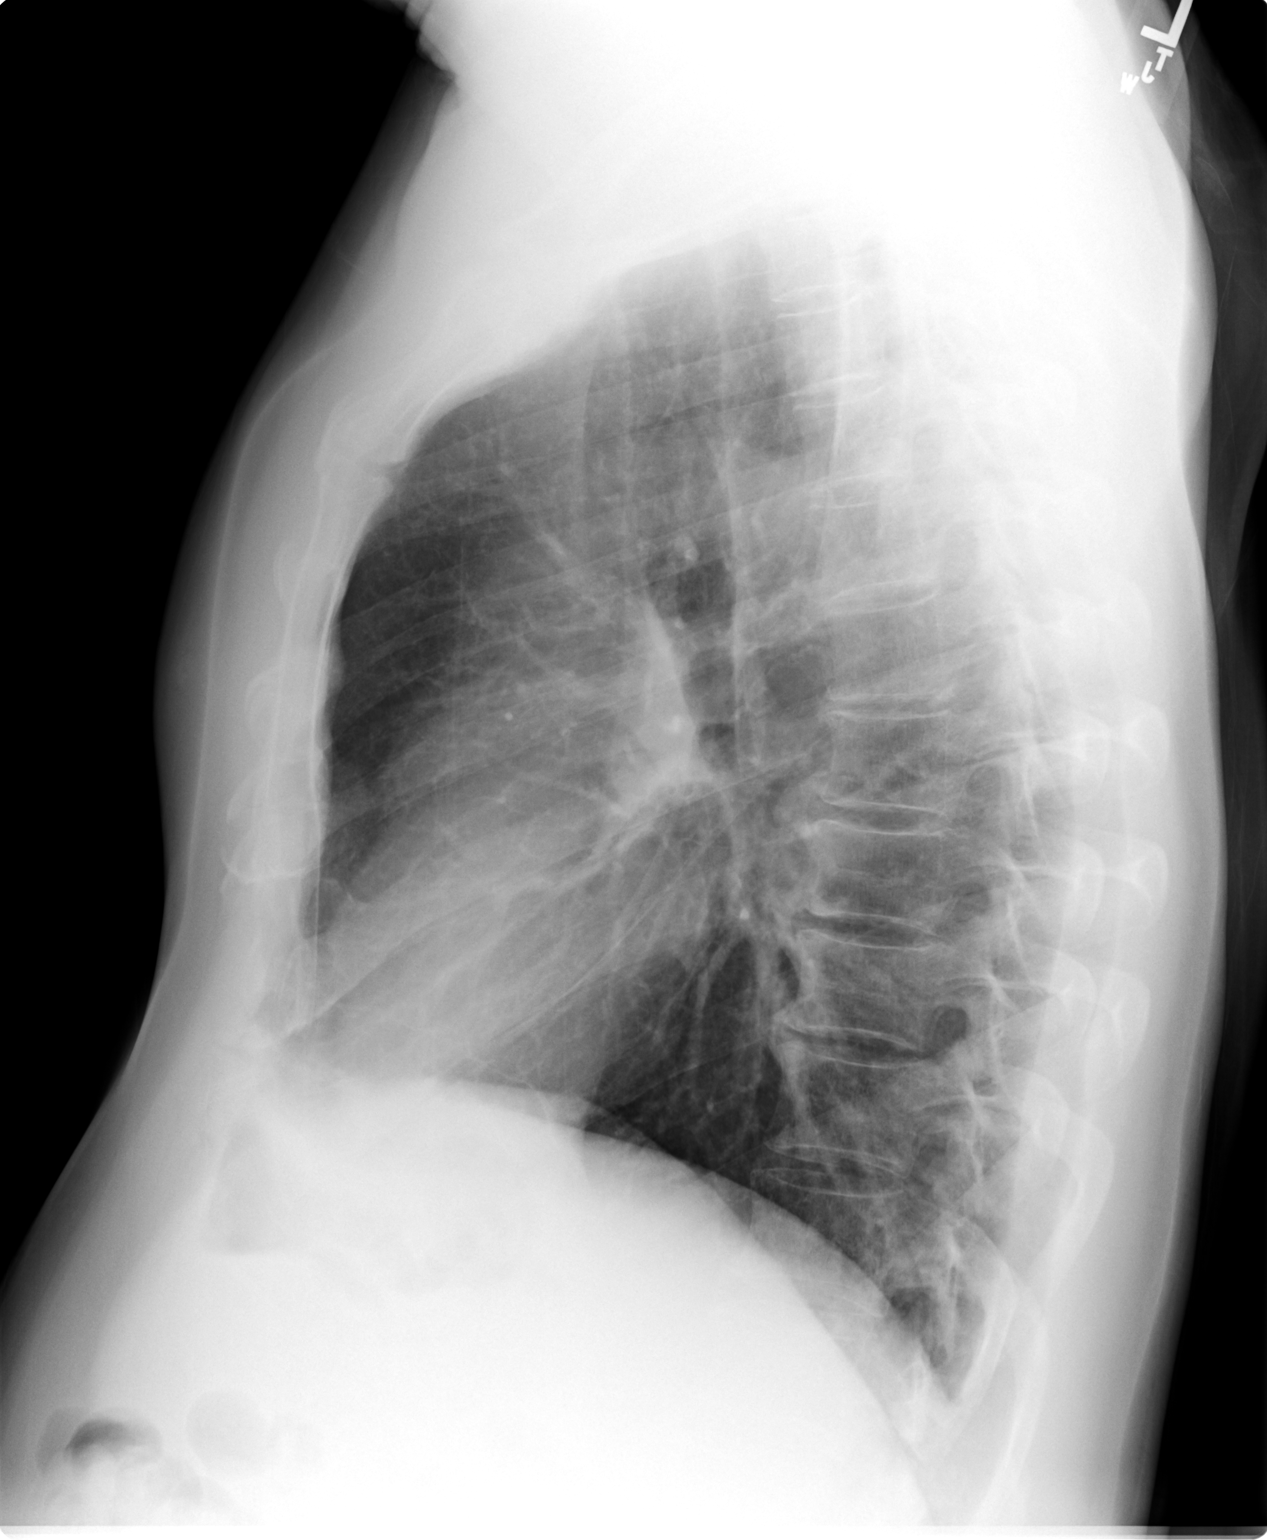

[2 of 2 positions shown; findings below may reference images not displayed]

FINDINGS: Heart and mediastinal contours.  Mild central airway
thickening and mild hyperaeration.  No active process.  Osseous
structures intact.
IMPRESSION: Mild chronic changes as above.  No active disease.

## 2012-09-11 ENCOUNTER — Other Ambulatory Visit: Payer: Self-pay

## 2012-10-02 ENCOUNTER — Encounter: Payer: BC Managed Care – PPO | Admitting: Internal Medicine

## 2012-10-12 ENCOUNTER — Telehealth: Payer: Self-pay | Admitting: Internal Medicine

## 2012-10-12 NOTE — Telephone Encounter (Signed)
Yes You can add on this Friday, or any others I haven't asked for the afternoon off (if there isn't anything else) Can do blood work then---I recommend only ~3-4 hours of fasting (so skip lunch but not breakfast if he comes in the afternoon----or a lite early lunch if 3-4PM appt)

## 2012-10-12 NOTE — Telephone Encounter (Signed)
Patient called to schedule an appointment for a cpx and the first available appointment for a cpx is on 04/26/13.  Patient said he can't wait that long to have his lab work done and would like to be seen sooner.  Can patient be seen sooner for a cpx?

## 2012-10-12 NOTE — Telephone Encounter (Signed)
I spoke with patient and he scheduled cpx appointment on 10/30/12 @ 2:00.  I told him he can eat breakfast,but to skip lunch.

## 2012-10-30 ENCOUNTER — Ambulatory Visit (INDEPENDENT_AMBULATORY_CARE_PROVIDER_SITE_OTHER): Payer: BC Managed Care – PPO | Admitting: Internal Medicine

## 2012-10-30 ENCOUNTER — Encounter: Payer: Self-pay | Admitting: Internal Medicine

## 2012-10-30 VITALS — BP 112/80 | HR 73 | Temp 98.0°F | Ht 71.5 in | Wt 207.0 lb

## 2012-10-30 DIAGNOSIS — Z0000 Encounter for general adult medical examination without abnormal findings: Secondary | ICD-10-CM

## 2012-10-30 DIAGNOSIS — N400 Enlarged prostate without lower urinary tract symptoms: Secondary | ICD-10-CM

## 2012-10-30 DIAGNOSIS — N529 Male erectile dysfunction, unspecified: Secondary | ICD-10-CM

## 2012-10-30 DIAGNOSIS — R002 Palpitations: Secondary | ICD-10-CM

## 2012-10-30 MED ORDER — TADALAFIL 20 MG PO TABS: 10.0000 mg | ORAL_TABLET | Freq: Every day | ORAL | Status: DC | PRN

## 2012-10-30 NOTE — Patient Instructions (Signed)
You may try melatonin 3-5mg  at bedtime--to see if that helps your sleep

## 2012-10-30 NOTE — Assessment & Plan Note (Signed)
Doesn't sound pathologic EKG is normal Will just check blood work

## 2012-10-30 NOTE — Progress Notes (Signed)
Subjective:    Patient ID: Louis Anderson, male    DOB: 1951-01-05, 61 y.o.   MRN: [id number]  HPI Doing okay Still has the low abdominal discomfort---burning sensation  No problems with function Doesn't remember if it was related to hernia surgery Discussed that this seems like ?nerve damage  Still working No changes socially  No current outpatient prescriptions on file prior to visit.    No Known Allergies  Past Medical History  Diagnosis Date  . Colon polyp   . BPH (benign prostatic hypertrophy)     Past Surgical History  Procedure Date  . Appendectomy   . Inguinal hernia repair 12/21/10    left    Family History  Problem Relation Age of Onset  . Diabetes Father     History   Social History  . Marital Status: Married    Spouse Name: N/A    Number of Children: 2  . Years of Education: N/A   Occupational History  . [employer]    Social History Main Topics  . Smoking status: Never Smoker   . Smokeless tobacco: Never Used  . Alcohol Use: 3.5 oz/week    7 drink(s) per week     Comment: glass of wine a day  . Drug Use: No  . Sexually Active: Not on file   Other Topics Concern  . Not on file   Social History Narrative  . No narrative on file   Review of Systems  Constitutional: Positive for fatigue. Negative for unexpected weight change.       Wears seat belt  HENT: Positive for hearing loss, sneezing and tinnitus. Negative for rhinorrhea, dental problem and postnasal drip.        Regular with dentist  Eyes: Negative for visual disturbance.       No diplopia or unilateral vision loss  Respiratory: Negative for cough, chest tightness and shortness of breath.   Cardiovascular: Positive for palpitations. Negative for chest pain and leg swelling.       Occ gets sense of racing heart--can occur at rest in bed. Not with exertion  No regular exercise  Gastrointestinal: Negative for nausea, vomiting, abdominal pain, constipation and blood in stool.         Only occasional heartburn  Genitourinary: Positive for urgency and frequency. Negative for difficulty urinating.       Stable urinary issues and nocturia Now more trouble with ED---ready to try meds  Musculoskeletal: Positive for back pain. Negative for joint swelling and arthralgias.  Skin: Negative for rash.       No suspicious lesions  Neurological: Positive for headaches. Negative for dizziness, syncope, weakness, light-headedness and numbness.       Intermittent occipital headaches if he gets cold--goes back at least 20 years  Hematological: Negative for adenopathy. Does not bruise/bleed easily.  Psychiatric/Behavioral: Positive for sleep disturbance. Negative for dysphoric mood. The patient is not nervous/anxious.        Has restless nights at times--occ daytime somnolence       Objective:   Physical Exam  Constitutional: He is oriented to person, place, and time. He appears well-developed and well-nourished. No distress.  HENT:  Head: Normocephalic and atraumatic.  Right Ear: External ear normal.  Left Ear: External ear normal.  Mouth/Throat: Oropharynx is clear and moist. No oropharyngeal exudate.  Eyes: Conjunctivae normal and EOM are normal. Pupils are equal, round, and reactive to light.  Neck: Normal range of motion. Neck supple. No thyromegaly present.  Cardiovascular: Normal rate, regular rhythm, normal heart sounds and intact distal pulses.  Exam reveals no gallop.   No murmur heard. Pulmonary/Chest: Effort normal and breath sounds normal. No respiratory distress. He has no wheezes. He has no rales.  Abdominal: Soft. There is no tenderness.  Musculoskeletal: He exhibits no edema and no tenderness.  Lymphadenopathy:    He has no cervical adenopathy.  Neurological: He is alert and oriented to person, place, and time.  Skin: No rash noted. No erythema.  Psychiatric: He has a normal mood and affect. His behavior is normal. Thought content normal.           Assessment & Plan:

## 2012-10-30 NOTE — Assessment & Plan Note (Signed)
Seems healthy Discussed fitness though Still prefers no flu shot Will check PSA after discussion

## 2012-10-30 NOTE — Assessment & Plan Note (Signed)
Mild symptoms Will hold off on Rx 

## 2012-10-30 NOTE — Assessment & Plan Note (Signed)
More bothersome Will try Rx

## 2012-10-31 LAB — BASIC METABOLIC PANEL
BUN: 14 mg/dL (ref 6–23)
CO2: 26 mEq/L (ref 19–32)
Chloride: 103 mEq/L (ref 96–112)
Glucose, Bld: 77 mg/dL (ref 70–99)
Potassium: 4.6 mEq/L (ref 3.5–5.3)
Sodium: 142 mEq/L (ref 135–145)

## 2012-10-31 LAB — HEPATIC FUNCTION PANEL
ALT: 18 U/L (ref 0–53)
AST: 17 U/L (ref 0–37)
Bilirubin, Direct: 0.2 mg/dL (ref 0.0–0.3)
Total Protein: 6.3 g/dL (ref 6.0–8.3)

## 2012-10-31 LAB — CBC WITH DIFFERENTIAL/PLATELET
Basophils Absolute: 0 10*3/uL (ref 0.0–0.1)
Basophils Relative: 0 % (ref 0–1)
Eosinophils Absolute: 0.2 10*3/uL (ref 0.0–0.7)
Eosinophils Relative: 3 % (ref 0–5)
HCT: 45.3 % (ref 39.0–52.0)
Lymphocytes Relative: 27 % (ref 12–46)
MCH: 30.9 pg (ref 26.0–34.0)
MCHC: 35.3 g/dL (ref 30.0–36.0)
MCV: 87.5 fL (ref 78.0–100.0)
Monocytes Absolute: 0.7 10*3/uL (ref 0.1–1.0)
Platelets: 238 10*3/uL (ref 150–400)
RDW: 13.6 % (ref 11.5–15.5)

## 2012-10-31 LAB — LIPID PANEL
Cholesterol: 175 mg/dL (ref 0–200)
Triglycerides: 76 mg/dL (ref <150)

## 2012-11-03 ENCOUNTER — Encounter: Payer: Self-pay | Admitting: *Deleted

## 2013-12-08 ENCOUNTER — Encounter: Payer: Self-pay | Admitting: Internal Medicine

## 2013-12-08 ENCOUNTER — Ambulatory Visit (INDEPENDENT_AMBULATORY_CARE_PROVIDER_SITE_OTHER): Payer: BC Managed Care – PPO | Admitting: Internal Medicine

## 2013-12-08 ENCOUNTER — Encounter: Payer: Self-pay | Admitting: *Deleted

## 2013-12-08 VITALS — BP 140/80 | HR 103 | Temp 102.1°F | Wt 211.0 lb

## 2013-12-08 DIAGNOSIS — J111 Influenza due to unidentified influenza virus with other respiratory manifestations: Secondary | ICD-10-CM | POA: Insufficient documentation

## 2013-12-08 NOTE — Progress Notes (Signed)
   Subjective:    Patient ID: Louis Anderson, male    DOB: September 25, 1951, 62 y.o.   MRN: [id number]  HPI Started with cold symptoms 2 days ago Then that night got really bad Mainly has sore throat Chills, fever Slight cough  No SOB No body aches Some left ear discomfort  Using aleve-- not clearly helpful Chloraseptic made his throat worse  Current Outpatient Prescriptions on File Prior to Visit  Medication Sig Dispense Refill  . tadalafil (CIALIS) 20 MG tablet Take 0.5-1 tablets (10-20 mg total) by mouth daily as needed for erectile dysfunction.  6 tablet  5   No current facility-administered medications on file prior to visit.    No Known Allergies  Past Medical History  Diagnosis Date  . Colon polyp   . BPH (benign prostatic hypertrophy)     Past Surgical History  Procedure Laterality Date  . Appendectomy    . Inguinal hernia repair  12/21/10    left    Family History  Problem Relation Age of Onset  . Diabetes Father     History   Social History  . Marital Status: Married    Spouse Name: N/A    Number of Children: 2  . Years of Education: N/A   Occupational History  . [employer]    Social History Main Topics  . Smoking status: Never Smoker   . Smokeless tobacco: Never Used  . Alcohol Use: 3.5 oz/week    7 drink(s) per week     Comment: glass of wine a day  . Drug Use: No  . Sexual Activity: Not on file   Other Topics Concern  . Not on file   Social History Narrative  . No narrative on file   Review of Systems No rash No vomiting or diarrhea Appetite is off some--due to sore throat    Objective:   Physical Exam  Constitutional: He appears well-developed and well-nourished.  Looks uncomfortable but no distress  HENT:  Mild nasal congestion TMs normal Slight pharyngeal injection without exudates  Neck: Normal range of motion. Neck supple.  Pulmonary/Chest: Effort normal and breath sounds normal. No respiratory distress. He has no  wheezes. He has no rales.  Lymphadenopathy:    He has no cervical adenopathy.  Skin: No rash noted.          Assessment & Plan:

## 2013-12-08 NOTE — Patient Instructions (Signed)
Please continue the aleve 2 tabs twice a day Try honey for your throat  Call back to set up a physical in 4-6 months

## 2013-12-08 NOTE — Assessment & Plan Note (Signed)
Clearly has the flu Too late to consider antivirals Discussed supportive care OOW probably till the 22nd

## 2014-08-31 ENCOUNTER — Encounter: Payer: Self-pay | Admitting: Internal Medicine

## 2014-12-30 ENCOUNTER — Ambulatory Visit (INDEPENDENT_AMBULATORY_CARE_PROVIDER_SITE_OTHER): Payer: Commercial Managed Care - HMO | Admitting: Internal Medicine

## 2014-12-30 ENCOUNTER — Encounter: Payer: Self-pay | Admitting: Internal Medicine

## 2014-12-30 VITALS — BP 120/80 | HR 71 | Temp 97.9°F | Ht 72.0 in | Wt 213.0 lb

## 2014-12-30 DIAGNOSIS — R002 Palpitations: Secondary | ICD-10-CM | POA: Insufficient documentation

## 2014-12-30 DIAGNOSIS — Z7189 Other specified counseling: Secondary | ICD-10-CM | POA: Diagnosis not present

## 2014-12-30 DIAGNOSIS — N400 Enlarged prostate without lower urinary tract symptoms: Secondary | ICD-10-CM

## 2014-12-30 DIAGNOSIS — Z23 Encounter for immunization: Secondary | ICD-10-CM | POA: Diagnosis not present

## 2014-12-30 DIAGNOSIS — Z0000 Encounter for general adult medical examination without abnormal findings: Secondary | ICD-10-CM | POA: Diagnosis not present

## 2014-12-30 LAB — CBC WITH DIFFERENTIAL/PLATELET
BASOS ABS: 0 10*3/uL (ref 0.0–0.1)
BASOS PCT: 0.7 % (ref 0.0–3.0)
EOS PCT: 3.8 % (ref 0.0–5.0)
Eosinophils Absolute: 0.3 10*3/uL (ref 0.0–0.7)
HCT: 47.6 % (ref 39.0–52.0)
HEMOGLOBIN: 15.7 g/dL (ref 13.0–17.0)
Lymphocytes Relative: 24.7 % (ref 12.0–46.0)
Lymphs Abs: 1.7 10*3/uL (ref 0.7–4.0)
MCHC: 32.9 g/dL (ref 30.0–36.0)
MCV: 93.6 fl (ref 78.0–100.0)
MONO ABS: 0.6 10*3/uL (ref 0.1–1.0)
Monocytes Relative: 8.1 % (ref 3.0–12.0)
NEUTROS ABS: 4.4 10*3/uL (ref 1.4–7.7)
Neutrophils Relative %: 62.7 % (ref 43.0–77.0)
Platelets: 235 10*3/uL (ref 150.0–400.0)
RBC: 5.09 Mil/uL (ref 4.22–5.81)
RDW: 13.7 % (ref 11.5–15.5)
WBC: 7.1 10*3/uL (ref 4.0–10.5)

## 2014-12-30 LAB — COMPREHENSIVE METABOLIC PANEL
ALBUMIN: 4.2 g/dL (ref 3.5–5.2)
ALT: 27 U/L (ref 0–53)
AST: 19 U/L (ref 0–37)
Alkaline Phosphatase: 56 U/L (ref 39–117)
BUN: 18 mg/dL (ref 6–23)
CO2: 29 mEq/L (ref 19–32)
CREATININE: 0.9 mg/dL (ref 0.4–1.5)
Calcium: 8.9 mg/dL (ref 8.4–10.5)
Chloride: 106 mEq/L (ref 96–112)
GFR: 87.48 mL/min (ref >60.00)
GLUCOSE: 103 mg/dL — AB (ref 70–99)
Potassium: 4.4 mEq/L (ref 3.5–5.1)
Sodium: 141 mEq/L (ref 135–145)
TOTAL PROTEIN: 6.7 g/dL (ref 6.0–8.3)
Total Bilirubin: 1 mg/dL (ref 0.2–1.2)

## 2014-12-30 LAB — PSA: PSA: 1.49 ng/mL (ref 0.10–4.00)

## 2014-12-30 LAB — LIPID PANEL
Cholesterol: 200 mg/dL (ref 0–200)
HDL: 51.1 mg/dL (ref >39.00)
LDL Cholesterol: 132 mg/dL — ABNORMAL HIGH (ref 0–99)
NonHDL: 148.9
Total CHOL/HDL Ratio: 4
Triglycerides: 83 mg/dL (ref 0.0–149.0)
VLDL: 16.6 mg/dL (ref 0.0–40.0)

## 2014-12-30 LAB — T4, FREE: FREE T4: 0.7 ng/dL (ref 0.60–1.60)

## 2014-12-30 NOTE — Progress Notes (Signed)
Subjective:    Patient ID: Louis Anderson, male    DOB: May 18, 1951, 64 y.o.   MRN: [id number]  HPI Here for physical and Medicare wellness Reviewed form and advanced directives No falls  No depression or anhedonia Reviewed other physicians Hearing is not good---did try hearing aide and wasn't happy with it Vision is fine Independent with instrumental ADLs 1-2 drinks of wine daily No tobacco No apparent cognitive problems  Only concern is some swelling in posterior and groin Notices it when he showers Keeping him from going back on his bicycle (hasn't been cycling)--can feel it when he is working  He did retire Has lots of chores for family that has kept him busy Does go in occasionally to prior job  Still has urinary frequency Nocturia x 3-4 Fairly good stream--but dribbling at the end  No current outpatient prescriptions on file prior to visit.   No current facility-administered medications on file prior to visit.    No Known Allergies  Past Medical History  Diagnosis Date  . Colon polyp   . BPH (benign prostatic hypertrophy)     Past Surgical History  Procedure Laterality Date  . Appendectomy    . Inguinal hernia repair  12/21/10    left    Family History  Problem Relation Age of Onset  . Diabetes Father     History   Social History  . Marital Status: Married    Spouse Name: N/A    Number of Children: 2  . Years of Education: N/A   Occupational History  . [employer]     Retired   Social History Main Topics  . Smoking status: Never Smoker   . Smokeless tobacco: Never Used  . Alcohol Use: 3.5 oz/week    7 drink(s) per week     Comment: glass of wine a day  . Drug Use: No  . Sexual Activity: Not on file   Other Topics Concern  . Not on file   Social History Narrative   No living will or health care POA.   Would want wife to make decisions   Would accept resuscitation but no prolonged ventilation   No tube feeds if cognitively  unaware   Review of Systems  Constitutional: Negative for fatigue and unexpected weight change.       Wears seat belt  HENT: Positive for hearing loss and tinnitus. Negative for dental problem.        Keeps up with dentist  Eyes: Negative for visual disturbance.       No diplopia or unilateral vision loss  Respiratory: Negative for cough, chest tightness and shortness of breath.   Cardiovascular: Positive for palpitations. Negative for chest pain and leg swelling.       Rare palpitation at rest  Gastrointestinal: Positive for constipation. Negative for nausea, vomiting, abdominal pain and blood in stool.  Endocrine: Negative for polydipsia and polyuria.  Genitourinary: Positive for frequency and difficulty urinating.       No sex---no problem  Musculoskeletal: Negative for back pain and joint swelling.       Left elbow pain at times---sleeve helps  Skin: Negative for rash.       No suspicious lesions  Allergic/Immunologic: Negative for environmental allergies and immunocompromised state.  Neurological: Positive for headaches. Negative for dizziness, syncope, weakness, light-headedness and numbness.       Occ occipital headache in the cold weather  Hematological: Negative for adenopathy. Does not bruise/bleed easily.  Psychiatric/Behavioral:  Positive for sleep disturbance. Negative for dysphoric mood. The patient is not nervous/anxious.        Some trouble getting back to sleep after nocturia       Objective:   Physical Exam  Constitutional: He is oriented to person, place, and time. He appears well-developed and well-nourished. No distress.  HENT:  Head: Normocephalic and atraumatic.  Right Ear: External ear normal.  Left Ear: External ear normal.  Mouth/Throat: Oropharynx is clear and moist. No oropharyngeal exudate.  Eyes: Conjunctivae and EOM are normal. Pupils are equal, round, and reactive to light.  Neck: Normal range of motion. Neck supple. No thyromegaly present.    Cardiovascular: Normal rate, regular rhythm, normal heart sounds and intact distal pulses.  Exam reveals no gallop.   No murmur heard. Pulmonary/Chest: Effort normal and breath sounds normal. No respiratory distress. He has no wheezes. He has no rales.  Abdominal: Soft. There is no tenderness.  Genitourinary:  Scrotum normal Slight extra fold of left buttock--no mass or hernia  Musculoskeletal: He exhibits no edema or tenderness.  Lymphadenopathy:    He has no cervical adenopathy.  Neurological: He is alert and oriented to person, place, and time.  President-- "Quay [place]row, [place]" [phone] [id number] Recall 3/3  Skin: No rash noted. No erythema.  Psychiatric: He has a normal mood and affect. His behavior is normal.          Assessment & Plan:

## 2014-12-30 NOTE — Patient Instructions (Signed)
DASH Eating Plan °DASH stands for "Dietary Approaches to Stop Hypertension." The DASH eating plan is a healthy eating plan that has been shown to reduce high blood pressure (hypertension). Additional health benefits may include reducing the risk of type 2 diabetes mellitus, heart disease, and stroke. The DASH eating plan may also help with weight loss. °WHAT DO I NEED TO KNOW ABOUT THE DASH EATING PLAN? °For the DASH eating plan, you will follow these general guidelines: °· Choose foods with a percent daily value for sodium of less than 5% (as listed on the food label). °· Use salt-free seasonings or herbs instead of table salt or sea salt. °· Check with your health care provider or pharmacist before using salt substitutes. °· Eat lower-sodium products, often labeled as "lower sodium" or "no salt added." °· Eat fresh foods. °· Eat more vegetables, fruits, and low-fat dairy products. °· Choose whole grains. Look for the word "whole" as the first word in the ingredient list. °· Choose fish and skinless chicken or turkey more often than red meat. Limit fish, poultry, and meat to 6 oz (170 g) each day. °· Limit sweets, desserts, sugars, and sugary drinks. °· Choose heart-healthy fats. °· Limit cheese to 1 oz (28 g) per day. °· Eat more home-cooked food and less restaurant, buffet, and fast food. °· Limit fried foods. °· Cook foods using methods other than frying. °· Limit canned vegetables. If you do use them, rinse them well to decrease the sodium. °· When eating at a restaurant, ask that your food be prepared with less salt, or no salt if possible. °WHAT FOODS CAN I EAT? °Seek help from a dietitian for individual calorie needs. °Grains °Whole grain or whole wheat bread. Brown rice. Whole grain or whole wheat pasta. Quinoa, bulgur, and whole grain cereals. Low-sodium cereals. Corn or whole wheat flour tortillas. Whole grain cornbread. Whole grain crackers. Low-sodium crackers. °Vegetables °Fresh or frozen vegetables  (raw, steamed, roasted, or grilled). Low-sodium or reduced-sodium tomato and vegetable juices. Low-sodium or reduced-sodium tomato sauce and paste. Low-sodium or reduced-sodium canned vegetables.  °Fruits °All fresh, canned (in natural juice), or frozen fruits. °Meat and Other Protein Products °Ground beef (85% or leaner), grass-fed beef, or beef trimmed of fat. Skinless chicken or turkey. Ground chicken or turkey. Pork trimmed of fat. All fish and seafood. Eggs. Dried beans, peas, or lentils. Unsalted nuts and seeds. Unsalted canned beans. °Dairy °Low-fat dairy products, such as skim or 1% milk, 2% or reduced-fat cheeses, low-fat ricotta or cottage cheese, or plain low-fat yogurt. Low-sodium or reduced-sodium cheeses. °Fats and Oils °Tub margarines without trans fats. Light or reduced-fat mayonnaise and salad dressings (reduced sodium). Avocado. Safflower, olive, or canola oils. Natural peanut or almond butter. °Other °Unsalted popcorn and pretzels. °The items listed above may not be a complete list of recommended foods or beverages. Contact your dietitian for more options. °WHAT FOODS ARE NOT RECOMMENDED? °Grains °White bread. White pasta. White rice. Refined cornbread. Bagels and croissants. Crackers that contain trans fat. °Vegetables °Creamed or fried vegetables. Vegetables in a cheese sauce. Regular canned vegetables. Regular canned tomato sauce and paste. Regular tomato and vegetable juices. °Fruits °Dried fruits. Canned fruit in light or heavy syrup. Fruit juice. °Meat and Other Protein Products °Fatty cuts of meat. Ribs, chicken wings, bacon, sausage, bologna, salami, chitterlings, fatback, hot dogs, bratwurst, and packaged luncheon meats. Salted nuts and seeds. Canned beans with salt. °Dairy °Whole or 2% milk, cream, half-and-half, and cream cheese. Whole-fat or sweetened yogurt. Full-fat   cheeses or blue cheese. Nondairy creamers and whipped toppings. Processed cheese, cheese spreads, or cheese  curds. °Condiments °Onion and garlic salt, seasoned salt, table salt, and sea salt. Canned and packaged gravies. Worcestershire sauce. Tartar sauce. Barbecue sauce. Teriyaki sauce. Soy sauce, including reduced sodium. Steak sauce. Fish sauce. Oyster sauce. Cocktail sauce. Horseradish. Ketchup and mustard. Meat flavorings and tenderizers. Bouillon cubes. Hot sauce. Tabasco sauce. Marinades. Taco seasonings. Relishes. °Fats and Oils °Butter, stick margarine, lard, shortening, ghee, and bacon fat. Coconut, palm kernel, or palm oils. Regular salad dressings. °Other °Pickles and olives. Salted popcorn and pretzels. °The items listed above may not be a complete list of foods and beverages to avoid. Contact your dietitian for more information. °WHERE CAN I FIND MORE INFORMATION? °National Heart, [employer], [employer] Institute: www.nhlbi.nih.gov/health/health-topics/topics/dash/ °Document Released: 11/28/2011 Document Revised: 04/25/2014 Document Reviewed: 10/13/2013 °[employer]® Patient Information ©2015 [employer]. This information is not intended to replace advice given to you by your health care provider. Make sure you discuss any questions you have with your health care provider. ° °

## 2014-12-30 NOTE — Assessment & Plan Note (Signed)
Just at rest Doesn't sound pathologic Will just check labs

## 2014-12-30 NOTE — Assessment & Plan Note (Signed)
Ongoing symptoms Not ready for meds If worsens, will try tamsulosin

## 2014-12-30 NOTE — Assessment & Plan Note (Signed)
Fairly healthy but needs to work on fitness Will check PSA after discussion Colonoscopy due 2016-2019 (was told 7-10 years)--will hold off for now Will take prevnar but still doesn't want flu shot

## 2014-12-30 NOTE — Progress Notes (Signed)
Pre visit review using our clinic review tool, if applicable. No additional management support is needed unless otherwise documented below in the visit note. 

## 2014-12-30 NOTE — Addendum Note (Signed)
Addended by: Despina Hidden on: 12/30/2014 02:27 PM   Modules accepted: Orders

## 2014-12-30 NOTE — Assessment & Plan Note (Signed)
See social history Blank forms given 

## 2015-01-02 ENCOUNTER — Encounter: Payer: Self-pay | Admitting: *Deleted

## 2015-02-07 ENCOUNTER — Encounter: Payer: Self-pay | Admitting: Internal Medicine

## 2015-07-18 DIAGNOSIS — H43813 Vitreous degeneration, bilateral: Secondary | ICD-10-CM | POA: Diagnosis not present

## 2015-07-18 DIAGNOSIS — H5213 Myopia, bilateral: Secondary | ICD-10-CM | POA: Diagnosis not present

## 2015-07-27 ENCOUNTER — Ambulatory Visit (INDEPENDENT_AMBULATORY_CARE_PROVIDER_SITE_OTHER): Payer: Commercial Managed Care - HMO | Admitting: Internal Medicine

## 2015-07-27 ENCOUNTER — Encounter: Payer: Self-pay | Admitting: Internal Medicine

## 2015-07-27 VITALS — BP 112/60 | HR 68 | Temp 98.3°F | Ht 72.0 in | Wt 180.0 lb

## 2015-07-27 DIAGNOSIS — M7522 Bicipital tendinitis, left shoulder: Secondary | ICD-10-CM | POA: Insufficient documentation

## 2015-07-27 NOTE — Patient Instructions (Signed)
Please try ice for 10-15 minutes several times a day. Try aleve--- 2 tabs twice a day for the next 1-2 weeks. Let me know if it is not improving

## 2015-07-27 NOTE — Progress Notes (Signed)
Pre visit review using our clinic review tool, if applicable. No additional management support is needed unless otherwise documented below in the visit note. 

## 2015-07-27 NOTE — Progress Notes (Signed)
   Subjective:    Patient ID: Louis Anderson, male    DOB: [birth year], 64 y.o.   MRN: [id number]  HPI Here due to left arm pain  Having pain in lateral upper left arm for several weeks No known injury Hard to abduct at all  No Rx other than hot pack 1-2 aleve here and there--not clearly helpful  No current outpatient prescriptions on file prior to visit.   No current facility-administered medications on file prior to visit.    No Known Allergies  Past Medical History  Diagnosis Date  . Colon polyp   . BPH (benign prostatic hypertrophy)     Past Surgical History  Procedure Laterality Date  . Appendectomy    . Inguinal hernia repair  12/21/10    left    Family History  Problem Relation Age of Onset  . Diabetes Father     History   Social History  . Marital Status: Married    Spouse Name: N/A  . Number of Children: 2  . Years of Education: N/A   Occupational History  . [employer]     Retired   Social History Main Topics  . Smoking status: Never Smoker   . Smokeless tobacco: Never Used  . Alcohol Use: 3.5 oz/week    7 drink(s) per week     Comment: glass of wine a day  . Drug Use: No  . Sexual Activity: Not on file   Other Topics Concern  . Not on file   Social History Narrative   No living will or health care POA.   Would want wife to make decisions   Would accept resuscitation but no prolonged ventilation   No tube feeds if cognitively unaware   Review of Systems Lost 25# with increased walking Cut out all fast foods, sweets, etc    Objective:   Physical Exam  Constitutional: He appears well-developed and well-nourished. No distress.  Musculoskeletal:  No shoulder swelling or tenderness Can passively abduct left shoulder fully when anterior--not to side No abnormality of elbow or wrist/hand Some localized tenderness over proximal biceps No bursa tenderness          Assessment & Plan:

## 2015-07-27 NOTE — Assessment & Plan Note (Signed)
No problem with shoulder--as he correctly surmised Discussed using ice Regular aleve for next couple of weeks Should be self limited

## 2015-09-08 ENCOUNTER — Ambulatory Visit (INDEPENDENT_AMBULATORY_CARE_PROVIDER_SITE_OTHER): Payer: Commercial Managed Care - HMO | Admitting: Internal Medicine

## 2015-09-08 ENCOUNTER — Encounter: Payer: Self-pay | Admitting: Internal Medicine

## 2015-09-08 ENCOUNTER — Telehealth: Payer: Self-pay | Admitting: Internal Medicine

## 2015-09-08 VITALS — BP 120/80 | HR 68 | Temp 97.8°F | Wt 178.0 lb

## 2015-09-08 DIAGNOSIS — S41151A Open bite of right upper arm, initial encounter: Secondary | ICD-10-CM

## 2015-09-08 DIAGNOSIS — W540XXA Bitten by dog, initial encounter: Secondary | ICD-10-CM | POA: Diagnosis not present

## 2015-09-08 DIAGNOSIS — S41159A Open bite of unspecified upper arm, initial encounter: Secondary | ICD-10-CM | POA: Insufficient documentation

## 2015-09-08 MED ORDER — AMOXICILLIN-POT CLAVULANATE 875-125 MG PO TABS: 1.0000 | ORAL_TABLET | Freq: Two times a day (BID) | ORAL | Status: DC

## 2015-09-08 NOTE — Progress Notes (Signed)
   Subjective:    Patient ID: Louis Anderson, male    DOB: 1951/07/31, 64 y.o.   MRN: [id number]  HPI Here due to dog bite  On morning walk, has seen the dog before Came right at him Bit him on the right arm He called animal control already  No current outpatient prescriptions on file prior to visit.   No current facility-administered medications on file prior to visit.    No Known Allergies  Past Medical History  Diagnosis Date  . Colon polyp   . BPH (benign prostatic hypertrophy)     Past Surgical History  Procedure Laterality Date  . Appendectomy    . Inguinal hernia repair  12/21/10    left    Family History  Problem Relation Age of Onset  . Diabetes Father     Social History   Social History  . Marital Status: Married    Spouse Name: N/A  . Number of Children: 2  . Years of Education: N/A   Occupational History  . [employer]     Retired   Social History Main Topics  . Smoking status: Never Smoker   . Smokeless tobacco: Never Used  . Alcohol Use: 3.5 oz/week    7 drink(s) per week     Comment: glass of wine a day  . Drug Use: No  . Sexual Activity: Not on file   Other Topics Concern  . Not on file   Social History Narrative   No living will or health care POA.   Would want wife to make decisions   Would accept resuscitation but no prolonged ventilation   No tube feeds if cognitively unaware   Review of Systems No fever Did clean with soap and water--then peroxide    Objective:   Physical Exam  Skin:  Superficial wounds on extensor right forearm---teeth scrapes and several superficial skin breaks          Assessment & Plan:

## 2015-09-08 NOTE — Progress Notes (Signed)
Pre visit review using our clinic review tool, if applicable. No additional management support is needed unless otherwise documented below in the visit note. 

## 2015-09-08 NOTE — Telephone Encounter (Signed)
Tried to call pt and phone is still busy. Pt has appt 09/07/14 at 11:15 to see Dr Silvio Pate.

## 2015-09-08 NOTE — Patient Instructions (Signed)
If the dog cannot be found, you will need to start rabies vaccinations. This will need to be at the emergency room unless animal control knows another location that has the vaccine.

## 2015-09-08 NOTE — Telephone Encounter (Signed)
Will evaluate at OV 

## 2015-09-08 NOTE — Telephone Encounter (Signed)
Patient Name: [name]  Gender: Male  DOB: 1951/11/02   Age: 64 Y 41 M 9 D  Return Phone Number: [phone] (Primary)  Address:   City/State/Zip: [place]    Client [facility] Client  Client Site [facility]  Physician Viviana Simpler   Contact Type Call  Call Type Triage / [place] Name Arbie Cookey  Relationship To Patient Mother  Appointment Disposition EMR Caller Not Reached  Return Phone Number [phone] (Primary)  Chief Complaint Animal Bite  Initial Comment Caller states husband was bitten by a dog; he was suppose to be seen at 80, but wants to know how to clean it   Nurse Assessment      Guidelines      Guideline Title Affirmed Question Affirmed Notes Nurse Date/Time (Ponder Time)         Disp. Time Eilene Ghazi Time) Disposition Final User   09/08/2015 9:18:48 AM Attempt made - line busy  Mallie Mussel, RN, Alveta Heimlich   09/08/2015 9:47:16 AM Attempt made - line busy  Mallie Mussel, RN, Alveta Heimlich    09/08/2015 10:03:17 AM FINAL ATTEMPT MADE - no message left Yes Mallie Mussel RN, Alveta Heimlich       After Care Instructions Given     Call Event Type User Date / Time Description        Comments  User: Reeves Forth, RN Date/Time Eilene Ghazi Time): 09/08/2015 10:03:02 AM  1002- Final attempt made x 2. Still getting busy signal.

## 2015-09-08 NOTE — Assessment & Plan Note (Signed)
Superficial but still at risk Will give written Rx for augmentin to start if he gets infection UTD tetanus May need rabies vaccination if dog cannot be found

## 2016-08-14 ENCOUNTER — Telehealth: Payer: Self-pay | Admitting: Internal Medicine

## 2016-08-14 ENCOUNTER — Telehealth: Payer: Self-pay | Admitting: *Deleted

## 2016-08-14 NOTE — Telephone Encounter (Signed)
LVM for pt to call regarding scheduling a medicare wellness visit.

## 2016-08-14 NOTE — Telephone Encounter (Signed)
Called pt to set up AWV before 09/30  Left message

## 2017-01-06 ENCOUNTER — Encounter: Payer: Self-pay | Admitting: Internal Medicine

## 2017-01-06 ENCOUNTER — Ambulatory Visit (INDEPENDENT_AMBULATORY_CARE_PROVIDER_SITE_OTHER): Payer: Medicare HMO | Admitting: Internal Medicine

## 2017-01-06 VITALS — BP 102/70 | HR 63 | Temp 97.4°F | Ht 71.0 in | Wt 180.0 lb

## 2017-01-06 DIAGNOSIS — N401 Enlarged prostate with lower urinary tract symptoms: Secondary | ICD-10-CM | POA: Diagnosis not present

## 2017-01-06 DIAGNOSIS — Z0000 Encounter for general adult medical examination without abnormal findings: Secondary | ICD-10-CM | POA: Diagnosis not present

## 2017-01-06 DIAGNOSIS — E785 Hyperlipidemia, unspecified: Secondary | ICD-10-CM | POA: Diagnosis not present

## 2017-01-06 DIAGNOSIS — Z7189 Other specified counseling: Secondary | ICD-10-CM

## 2017-01-06 DIAGNOSIS — N138 Other obstructive and reflux uropathy: Secondary | ICD-10-CM

## 2017-01-06 DIAGNOSIS — Z23 Encounter for immunization: Secondary | ICD-10-CM | POA: Diagnosis not present

## 2017-01-06 LAB — CBC WITH DIFFERENTIAL/PLATELET
BASOS ABS: 0 10*3/uL (ref 0.0–0.1)
Basophils Relative: 0.5 % (ref 0.0–3.0)
EOS ABS: 0.3 10*3/uL (ref 0.0–0.7)
Eosinophils Relative: 4.5 % (ref 0.0–5.0)
HCT: 44 % (ref 39.0–52.0)
HEMOGLOBIN: 15.1 g/dL (ref 13.0–17.0)
LYMPHS ABS: 1.6 10*3/uL (ref 0.7–4.0)
Lymphocytes Relative: 21 % (ref 12.0–46.0)
MCHC: 34.3 g/dL (ref 30.0–36.0)
MCV: 90.4 fl (ref 78.0–100.0)
MONO ABS: 0.6 10*3/uL (ref 0.1–1.0)
Monocytes Relative: 8.2 % (ref 3.0–12.0)
NEUTROS PCT: 65.8 % (ref 43.0–77.0)
Neutro Abs: 4.9 10*3/uL (ref 1.4–7.7)
Platelets: 230 10*3/uL (ref 150.0–400.0)
RBC: 4.87 Mil/uL (ref 4.22–5.81)
RDW: 14 % (ref 11.5–15.5)
WBC: 7.4 10*3/uL (ref 4.0–10.5)

## 2017-01-06 LAB — COMPREHENSIVE METABOLIC PANEL
ALBUMIN: 4.3 g/dL (ref 3.5–5.2)
ALK PHOS: 43 U/L (ref 39–117)
ALT: 14 U/L (ref 0–53)
AST: 16 U/L (ref 0–37)
BILIRUBIN TOTAL: 1.1 mg/dL (ref 0.2–1.2)
BUN: 17 mg/dL (ref 6–23)
CO2: 30 mEq/L (ref 19–32)
CREATININE: 0.81 mg/dL (ref 0.40–1.50)
Calcium: 9.1 mg/dL (ref 8.4–10.5)
Chloride: 103 mEq/L (ref 96–112)
GFR: 100.71 mL/min (ref >60.00)
GLUCOSE: 91 mg/dL (ref 70–99)
POTASSIUM: 4.2 meq/L (ref 3.5–5.1)
SODIUM: 138 meq/L (ref 135–145)
TOTAL PROTEIN: 6.7 g/dL (ref 6.0–8.3)

## 2017-01-06 LAB — LIPID PANEL
CHOLESTEROL: 187 mg/dL (ref 0–200)
HDL: 69.3 mg/dL (ref >39.00)
LDL Cholesterol: 103 mg/dL — ABNORMAL HIGH (ref 0–99)
NONHDL: 117.22
Total CHOL/HDL Ratio: 3
Triglycerides: 72 mg/dL (ref 0.0–149.0)
VLDL: 14.4 mg/dL (ref 0.0–40.0)

## 2017-01-06 LAB — PSA: PSA: 1.47 ng/mL (ref 0.10–4.00)

## 2017-01-06 NOTE — Progress Notes (Signed)
Pre visit review using our clinic review tool, if applicable. No additional management support is needed unless otherwise documented below in the visit note. 

## 2017-01-06 NOTE — Assessment & Plan Note (Signed)
See social history 

## 2017-01-06 NOTE — Progress Notes (Signed)
   Subjective:    Patient ID: Louis Anderson, male    DOB: 01/10/1951, 66 y.o.   MRN: [id number]  HPI Here for Medicare wellness and follow up of chronic health conditions Reviewed form and advanced directives Reviewed other doctors Still enjoys regular wine No tobacco Regular with exercise Vision is okay Ongoing hearing loss--especially right. Aides didn't help (some tinnitus also) No falls No depression or anehdonia Independent with instrumental ADLs No change in memory (not great in short term)  Gets some vague left lower abdominal discomfort No apparent hernia--did have surgical repair in the past Bowels are fine No blood in stool  Ongoing urinary frequency Mildly annoying-- but not enough to take meds Nocturia x 2-3 is stable No sex/no problem  No recent palpitations No chest pain No SOB Walks fast daily--- exercise tolerance is good  No current outpatient prescriptions on file prior to visit.   No current facility-administered medications on file prior to visit.     No Known Allergies  Past Medical History:  Diagnosis Date  . BPH (benign prostatic hypertrophy)   . Colon polyp     Past Surgical History:  Procedure Laterality Date  . APPENDECTOMY    . INGUINAL HERNIA REPAIR  12/21/10   left    Family History  Problem Relation Age of Onset  . Diabetes Father     Social History   Social History  . Marital status: Married    Spouse name: N/A  . Number of children: 2  . Years of education: N/A   Occupational History  . [employer]     Retired   Social History Main Topics  . Smoking status: Never Smoker  . Smokeless tobacco: Never Used  . Alcohol use 3.5 oz/week    7 drink(s) per week     Comment: glass of wine a day  . Drug use: No  . Sexual activity: Not on file   Other Topics Concern  . Not on file   Social History Narrative   Has living will    Wife is health care POA   Would accept resuscitation but no prolonged ventilation     No tube feeds if cognitively unaware   Review of Systems Appetite is good Weight stable Sleeps well Wears seat belt Teeth okay --- recent implant (Dr Gilford Rile) Occasional aches and pains--- right foot pain at times. Nothing too much. No meds No rash or suspicious skin lesions No heartburn or dysphagia    Objective:   Physical Exam  Constitutional: He is oriented to person, place, and time. He appears well-developed and well-nourished. No distress.  HENT:  Mouth/Throat: Oropharynx is clear and moist. No oropharyngeal exudate.  Neck: Normal range of motion. Neck supple. No thyromegaly present.  Cardiovascular: Normal rate, regular rhythm, normal heart sounds and intact distal pulses.  Exam reveals no gallop.   No murmur heard. Pulmonary/Chest: Effort normal and breath sounds normal. No respiratory distress. He has no wheezes. He has no rales.  Abdominal: Soft. There is no tenderness.  Musculoskeletal: He exhibits no edema or tenderness.  Lymphadenopathy:    He has no cervical adenopathy.  Neurological: He is alert and oriented to person, place, and time.  President--- "Dwaine Deter, [place]" [phone] D-l-r-o-w Recall 3/3  Skin: No rash noted. No erythema.  Psychiatric: He has a normal mood and affect. His behavior is normal.          Assessment & Plan:

## 2017-01-06 NOTE — Assessment & Plan Note (Signed)
I have personally reviewed the Medicare Annual Wellness questionnaire and have noted 1. The patient's medical and social history 2. Their use of alcohol, tobacco or illicit drugs 3. Their current medications and supplements 4. The patient's functional ability including ADL's, fall risks, home safety risks and hearing or visual             impairment. 5. Diet and physical activities 6. Evidence for depression or mood disorders  The patients weight, height, BMI and visual acuity have been recorded in the chart I have made referrals, counseling and provided education to the patient based review of the above and I have provided the pt with a written personalized care plan for preventive services.  I have provided you with a copy of your personalized plan for preventive services. Please take the time to review along with your updated medication list.  Healthy Prefers no flu vaccine prevnar today Colon due next year Will do PSA after discussion

## 2017-01-06 NOTE — Assessment & Plan Note (Signed)
Borderline Fairly good lifestyle Will just recheck

## 2017-01-06 NOTE — Addendum Note (Signed)
Addended by: Pilar Grammes on: 01/06/2017 03:25 PM   Modules accepted: Orders

## 2017-01-06 NOTE — Assessment & Plan Note (Signed)
Mild symptoms only Would try tamsulosin if he worsens

## 2017-07-30 DIAGNOSIS — H524 Presbyopia: Secondary | ICD-10-CM | POA: Diagnosis not present

## 2017-07-30 DIAGNOSIS — H43813 Vitreous degeneration, bilateral: Secondary | ICD-10-CM | POA: Diagnosis not present

## 2017-07-30 DIAGNOSIS — H5213 Myopia, bilateral: Secondary | ICD-10-CM | POA: Diagnosis not present

## 2017-11-11 ENCOUNTER — Ambulatory Visit: Payer: Medicare HMO

## 2017-11-18 ENCOUNTER — Ambulatory Visit (INDEPENDENT_AMBULATORY_CARE_PROVIDER_SITE_OTHER): Payer: Medicare HMO

## 2017-11-18 DIAGNOSIS — Z23 Encounter for immunization: Secondary | ICD-10-CM

## 2018-01-07 ENCOUNTER — Ambulatory Visit (INDEPENDENT_AMBULATORY_CARE_PROVIDER_SITE_OTHER): Payer: Medicare HMO | Admitting: Internal Medicine

## 2018-01-07 ENCOUNTER — Encounter: Payer: Self-pay | Admitting: Internal Medicine

## 2018-01-07 VITALS — BP 110/66 | HR 63 | Temp 97.6°F | Ht 71.0 in | Wt 186.8 lb

## 2018-01-07 DIAGNOSIS — Z0000 Encounter for general adult medical examination without abnormal findings: Secondary | ICD-10-CM

## 2018-01-07 DIAGNOSIS — N138 Other obstructive and reflux uropathy: Secondary | ICD-10-CM | POA: Diagnosis not present

## 2018-01-07 DIAGNOSIS — N401 Enlarged prostate with lower urinary tract symptoms: Secondary | ICD-10-CM | POA: Diagnosis not present

## 2018-01-07 DIAGNOSIS — Z7189 Other specified counseling: Secondary | ICD-10-CM | POA: Diagnosis not present

## 2018-01-07 LAB — CBC
HEMATOCRIT: 46.1 % (ref 39.0–52.0)
Hemoglobin: 15.7 g/dL (ref 13.0–17.0)
MCHC: 34.1 g/dL (ref 30.0–36.0)
MCV: 91.3 fl (ref 78.0–100.0)
Platelets: 247 10*3/uL (ref 150.0–400.0)
RBC: 5.04 Mil/uL (ref 4.22–5.81)
RDW: 13.5 % (ref 11.5–15.5)
WBC: 6.9 10*3/uL (ref 4.0–10.5)

## 2018-01-07 LAB — COMPREHENSIVE METABOLIC PANEL
ALT: 12 U/L (ref 0–53)
AST: 18 U/L (ref 0–37)
Albumin: 4.4 g/dL (ref 3.5–5.2)
Alkaline Phosphatase: 48 U/L (ref 39–117)
BILIRUBIN TOTAL: 1.1 mg/dL (ref 0.2–1.2)
BUN: 14 mg/dL (ref 6–23)
CALCIUM: 8.9 mg/dL (ref 8.4–10.5)
CHLORIDE: 103 meq/L (ref 96–112)
CO2: 32 meq/L (ref 19–32)
CREATININE: 0.94 mg/dL (ref 0.40–1.50)
GFR: 84.57 mL/min (ref >60.00)
Glucose, Bld: 103 mg/dL — ABNORMAL HIGH (ref 70–99)
Potassium: 4.4 mEq/L (ref 3.5–5.1)
Sodium: 138 mEq/L (ref 135–145)
Total Protein: 7 g/dL (ref 6.0–8.3)

## 2018-01-07 MED ORDER — TAMSULOSIN HCL 0.4 MG PO CAPS: 0.4000 mg | ORAL_CAPSULE | Freq: Every day | ORAL | 3 refills | Status: DC

## 2018-01-07 NOTE — Assessment & Plan Note (Signed)
I have personally reviewed the Medicare Annual Wellness questionnaire and have noted 1. The patient's medical and social history 2. Their use of alcohol, tobacco or illicit drugs 3. Their current medications and supplements 4. The patient's functional ability including ADL's, fall risks, home safety risks and hearing or visual             impairment. 5. Diet and physical activities 6. Evidence for depression or mood disorders  The patients weight, height, BMI and visual acuity have been recorded in the chart I have made referrals, counseling and provided education to the patient based review of the above and I have provided the pt with a written personalized care plan for preventive services.  I have provided you with a copy of your personalized plan for preventive services. Please take the time to review along with your updated medication list.  Discussed resistance training UTD on imms Defer PSA to next year Due for colon in March

## 2018-01-07 NOTE — Progress Notes (Signed)
Hearing Screening   125Hz  250Hz  500Hz  1000Hz  2000Hz  3000Hz  4000Hz  6000Hz  8000Hz   Right ear:   25 40 0  0    Left ear:   25 40 0  0    Vision Screening Comments: Completed aug 2018

## 2018-01-07 NOTE — Assessment & Plan Note (Signed)
See social history 

## 2018-01-07 NOTE — Progress Notes (Signed)
Subjective:    Patient ID: Louis Anderson, male    DOB: 1951-08-24, 67 y.o.   MRN: [id number]  HPI Here for Medicare wellness visit and follow up of chronic health conditions Reviewed form and advanced directives Reviewed other doctors Still enjoys regular wine No tobacco Regular exercise No falls Vision is fine with glasses Significant high frequency hearing loss--discussed hearing aides No depression or anhedonia Independent with instrumental ADLs No sig memory problems  He has no new concerns  No palpitations No chest pain or SOB No dizziness or syncope No edema  More trouble with urinary stream Nocturia every 1-2 hours at times Ready to try medication  No current outpatient medications on file prior to visit.   No current facility-administered medications on file prior to visit.     No Known Allergies  Past Medical History:  Diagnosis Date  . BPH (benign prostatic hypertrophy)   . Colon polyp     Past Surgical History:  Procedure Laterality Date  . APPENDECTOMY    . INGUINAL HERNIA REPAIR  12/21/10   left    Family History  Problem Relation Age of Onset  . Diabetes Father     Social History   Socioeconomic History  . Marital status: Married    Spouse name: Not on file  . Number of children: 2  . Years of education: Not on file  . Highest education level: Not on file  Social Needs  . Financial resource strain: Not on file  . Food insecurity - worry: Not on file  . Food insecurity - inability: Not on file  . Transportation needs - medical: Not on file  . Transportation needs - non-medical: Not on file  Occupational History  . Occupation: Associate Professor    Comment: Retired  Tobacco Use  . Smoking status: Never Smoker  . Smokeless tobacco: Never Used  Substance and Sexual Activity  . Alcohol use: Yes    Alcohol/week: 3.5 oz    Types: 7 drink(s) per week    Comment: glass of wine a day  . Drug use: No  . Sexual activity: Not on file    Other Topics Concern  . Not on file  Social History Narrative   Has living will    Wife is health care POA   Would accept resuscitation but no prolonged ventilation   No tube feeds if cognitively unaware   Review of Systems Some foot pain--nothing worrisome Teeth okay--keeps up with dentist Has bump behind right ear---crusted and then heals. Will go back to derm Appetite is good Weight back up a bit--will bring back in line Sleeps reasonably well--other than nocturia Wears seat belt No sig back or joint pain Rare heartburn--- no dysphagia Bowels are okay---some constipation. Rarely takes something. No blood.    Objective:   Physical Exam  Constitutional: He is oriented to person, place, and time. No distress.  HENT:  Mouth/Throat: Oropharynx is clear and moist. No oropharyngeal exudate.  Neck: No thyromegaly present.  Cardiovascular: Normal rate, regular rhythm, normal heart sounds and intact distal pulses. Exam reveals no gallop.  No murmur heard. Pulmonary/Chest: Effort normal and breath sounds normal. No respiratory distress. He has no wheezes. He has no rales.  Abdominal: Soft. There is no tenderness.  Musculoskeletal: He exhibits no edema.  Lymphadenopathy:    He has no cervical adenopathy.  Neurological: He is alert and oriented to person, place, and time.  President--- "Dwaine Deter, [place]" [phone] D-l-r-o-w Recall 3/3  Skin:  No rash noted. No erythema.  Psychiatric: He has a normal mood and affect. His behavior is normal.          Assessment & Plan:

## 2018-01-07 NOTE — Assessment & Plan Note (Signed)
Symptoms worse Will try tamsulosin

## 2018-01-07 NOTE — Patient Instructions (Signed)
You are due for your colonoscopy in March.

## 2018-01-12 ENCOUNTER — Encounter: Payer: Self-pay | Admitting: Gastroenterology

## 2018-02-20 ENCOUNTER — Ambulatory Visit (AMBULATORY_SURGERY_CENTER): Payer: Self-pay | Admitting: *Deleted

## 2018-02-20 ENCOUNTER — Other Ambulatory Visit: Payer: Self-pay

## 2018-02-20 VITALS — Ht 71.0 in | Wt 190.6 lb

## 2018-02-20 DIAGNOSIS — Z86010 Personal history of colonic polyps: Secondary | ICD-10-CM

## 2018-02-20 MED ORDER — NA SULFATE-K SULFATE-MG SULF 17.5-3.13-1.6 GM/177ML PO SOLN: 1.0000 [IU] | Freq: Once | ORAL | 0 refills | Status: AC

## 2018-02-20 NOTE — Progress Notes (Signed)
No egg or soy allergy known to patient  No issues with past sedation with any surgeries  or procedures, no intubation problems  No diet pills per patient No home 02 use per patient  No blood thinners per patient  issues with constipation  Will take miralax when having issues.  Not all the time. No A fib or A flutter  EMMI video sent to pt's e mail

## 2018-02-27 ENCOUNTER — Encounter: Payer: Self-pay | Admitting: Gastroenterology

## 2018-03-06 ENCOUNTER — Encounter: Payer: Self-pay | Admitting: Gastroenterology

## 2018-03-06 ENCOUNTER — Other Ambulatory Visit: Payer: Self-pay

## 2018-03-06 ENCOUNTER — Ambulatory Visit (AMBULATORY_SURGERY_CENTER): Payer: Medicare HMO | Admitting: Gastroenterology

## 2018-03-06 VITALS — BP 114/75 | HR 57 | Temp 98.7°F | Resp 11 | Ht 71.0 in | Wt 186.0 lb

## 2018-03-06 DIAGNOSIS — K635 Polyp of colon: Secondary | ICD-10-CM

## 2018-03-06 DIAGNOSIS — Z1211 Encounter for screening for malignant neoplasm of colon: Secondary | ICD-10-CM

## 2018-03-06 DIAGNOSIS — D123 Benign neoplasm of transverse colon: Secondary | ICD-10-CM | POA: Diagnosis not present

## 2018-03-06 DIAGNOSIS — Z538 Procedure and treatment not carried out for other reasons: Secondary | ICD-10-CM | POA: Diagnosis not present

## 2018-03-06 MED ORDER — SODIUM CHLORIDE 0.9 % IV SOLN: 500.0000 mL | Freq: Once | INTRAVENOUS | Status: DC

## 2018-03-06 NOTE — Op Note (Addendum)
[facility] Patient Name: Louis Anderson Procedure Date: 03/06/2018 9:59 AM MRN: [id number] Endoscopist: Mallie Mussel L. Loletha Carrow , MD Age: 67 Referring MD:  Date of Birth: 10-10-1951 Gender: Male Account #: [id number] Procedure:                Colonoscopy Indications:              Screening for colorectal malignant neoplasm (no                            polyps last colonoscopy 02/2008) Medicines:                Monitored Anesthesia Care Procedure:                Pre-Anesthesia Assessment:                           - Prior to the procedure, a History and Physical                            was performed, and patient medications and                            allergies were reviewed. The patient's tolerance of                            previous anesthesia was also reviewed. The risks                            and benefits of the procedure and the sedation                            options and risks were discussed with the patient.                            All questions were answered, and informed consent                            was obtained. Prior Anticoagulants: The patient has                            taken no previous anticoagulant or antiplatelet                            agents. ASA Grade Assessment: II - A patient with                            mild systemic disease. After reviewing the risks                            and benefits, the patient was deemed in                            satisfactory condition to undergo the procedure.  After obtaining informed consent, the colonoscope                            was passed under direct vision. Throughout the                            procedure, the patient's blood pressure, pulse, and                            oxygen saturations were monitored continuously. The                            Colonoscope was introduced through the anus and                            advanced to the the cecum,  identified by the                            ileocecal valve. The colonoscopy was unusually                            difficult due to inadequate bowel prep and                            significant looping. Successful completion of the                            procedure was aided by changing the patient to a                            supine position and using manual pressure. The                            patient tolerated the procedure well. The quality                            of the bowel preparation was inadequate. The                            ileocecal valve and the rectum were photographed. Scope In: 10:24:10 AM Scope Out: 10:57:11 AM Scope Withdrawal Time: 0 hours 11 minutes 31 seconds  Total Procedure Duration: 0 hours 33 minutes 1 second  Findings:                 The perianal and digital rectal examinations were                            normal.                           The colon (entire examined portion) was                            significantly redundant. This made for challenging  scope passage, requiring 21 minutes to reach the                            cecum.                           A large amount of semi-liquid stool was found in                            the entire colon, interfering with visualization.                           Two sessile polyps were found in the hepatic                            flexure. The polyps were 5 to 8 mm in size. These                            polyps were removed with a cold snare. Resection                            and retrieval were complete.                           The exam was otherwise without abnormality on                            direct and retroflexion views (given limitation of                            prep). Complications:            No immediate complications. Estimated Blood Loss:     Estimated blood loss was minimal. Impression:               - Preparation of the colon  was inadequate.                           - Stool in the entire examined colon.                           - Two 5 to 8 mm polyps at the hepatic flexure,                            removed with a cold snare. Resected and retrieved.                           - The examination was otherwise normal on direct                            and retroflexion views. Recommendation:           - Patient has a contact number available for  emergencies. The signs and symptoms of potential                            delayed complications were discussed with the                            patient. Return to normal activities tomorrow.                            Written discharge instructions were provided to the                            patient.                           - Resume previous diet.                           - Continue present medications.                           - Await pathology results.                           - Repeat colonoscopy in 1 year for surveillance                            with 2 day prep of Suprep and Moviprep. Henry L. Loletha Carrow, MD 03/06/2018 11:01:56 AM This report has been signed electronically.

## 2018-03-06 NOTE — Patient Instructions (Signed)
Impression/Recommendations:  Polyp handout given to patient.  Resume previous diet. Continue present medications.  Repeat colonoscopy in 1 year for surveillance with 2 day prep.  YOU HAD AN ENDOSCOPIC PROCEDURE TODAY AT [facility]:   Refer to the procedure report that was given to you for any specific questions about what was found during the examination.  If the procedure report does not answer your questions, please call your gastroenterologist to clarify.  If you requested that your care partner not be given the details of your procedure findings, then the procedure report has been included in a sealed envelope for you to review at your convenience later.  YOU SHOULD EXPECT: Some feelings of bloating in the abdomen. Passage of more gas than usual.  Walking can help get rid of the air that was put into your GI tract during the procedure and reduce the bloating. If you had a lower endoscopy (such as a colonoscopy or flexible sigmoidoscopy) you may notice spotting of blood in your stool or on the toilet paper. If you underwent a bowel prep for your procedure, you may not have a normal bowel movement for a few days.  Please Note:  You might notice some irritation and congestion in your nose or some drainage.  This is from the oxygen used during your procedure.  There is no need for concern and it should clear up in a day or so.  SYMPTOMS TO REPORT IMMEDIATELY:   Following lower endoscopy (colonoscopy or flexible sigmoidoscopy):  Excessive amounts of blood in the stool  Significant tenderness or worsening of abdominal pains  Swelling of the abdomen that is new, acute  Fever of 100F or higher ools  For urgent or emergent issues, a gastroenterologist can be reached at any hour by calling [phone].   DIET:  We do recommend a small meal at first, but then you may proceed to your regular diet.  Drink plenty of fluids but you should avoid alcoholic beverages for 24  hours.  ACTIVITY:  You should plan to take it easy for the rest of today and you should NOT DRIVE or use heavy machinery until tomorrow (because of the sedation medicines used during the test).    FOLLOW UP: Our staff will call the number listed on your records the next business day following your procedure to check on you and address any questions or concerns that you may have regarding the information given to you following your procedure. If we do not reach you, we will leave a message.  However, if you are feeling well and you are not experiencing any problems, there is no need to return our call.  We will assume that you have returned to your regular daily activities without incident.  If any biopsies were taken you will be contacted by phone or by letter within the next 1-3 weeks.  Please call us at [phone] if you have not heard about the biopsies in 3 weeks.    SIGNATURES/CONFIDENTIALITY: You and/or your care partner have signed paperwork which will be entered into your electronic medical record.  These signatures attest to the fact that that the information above on your After Visit Summary has been reviewed and is understood.  Full responsibility of the confidentiality of this discharge information lies with you and/or your care-partner.

## 2018-03-06 NOTE — Progress Notes (Signed)
Called to room to assist during endoscopic procedure.  Patient ID and intended procedure confirmed with present staff. Received instructions for my participation in the procedure from the performing physician.  

## 2018-03-06 NOTE — Progress Notes (Signed)
A and O x3. Report to RN. Tolerated MAC anesthesia well.

## 2018-03-09 ENCOUNTER — Telehealth: Payer: Self-pay

## 2018-03-09 NOTE — Telephone Encounter (Signed)
  Follow up Call-  Call back number 03/06/2018  Post procedure Call Back phone  # [phone]  Permission to leave phone message Yes  Some recent data might be hidden     Patient questions:  Do you have a fever, pain , or abdominal swelling? No. Pain Score  0 *  Have you tolerated food without any problems? Yes.    Have you been able to return to your normal activities? Yes.    Do you have any questions about your discharge instructions: Diet   No. Medications  No. Follow up visit  No.  Do you have questions or concerns about your Care? No.  Actions: * If pain score is 4 or above: No action needed, pain <4.

## 2018-03-13 ENCOUNTER — Encounter: Payer: Self-pay | Admitting: Gastroenterology

## 2018-10-22 ENCOUNTER — Ambulatory Visit (INDEPENDENT_AMBULATORY_CARE_PROVIDER_SITE_OTHER): Payer: Medicare HMO

## 2018-10-22 DIAGNOSIS — Z23 Encounter for immunization: Secondary | ICD-10-CM | POA: Diagnosis not present

## 2018-12-21 ENCOUNTER — Ambulatory Visit: Payer: Medicare HMO | Admitting: Family Medicine

## 2018-12-21 ENCOUNTER — Encounter: Payer: Self-pay | Admitting: Family Medicine

## 2018-12-21 VITALS — BP 128/84 | HR 70 | Temp 97.8°F | Ht 71.0 in | Wt 195.5 lb

## 2018-12-21 DIAGNOSIS — S60551A Superficial foreign body of right hand, initial encounter: Secondary | ICD-10-CM

## 2018-12-21 MED ORDER — CEPHALEXIN 500 MG PO CAPS: 500.0000 mg | ORAL_CAPSULE | Freq: Two times a day (BID) | ORAL | 0 refills | Status: AC

## 2018-12-21 NOTE — Progress Notes (Signed)
   Subjective:     Louis Anderson is a 67 y.o. male presenting for splinter in right hand (happened on Friday 12/18/18. Swelling, redness present, has been oozing some. No fever.)     HPI   #Splinter in hand - does not remember getting it in there - piece of wood - 2x4 - got it there on Friday - this one is bad - was able to get small amount out at home - redness and oozing  - had a lot of pus this morning   Review of Systems  Constitutional: Negative for chills and fever.     Social History   Tobacco Use  Smoking Status Never Smoker  Smokeless Tobacco Never Used        Objective:    BP Readings from Last 3 Encounters:  12/21/18 128/84  03/06/18 114/75  01/07/18 110/66   Wt Readings from Last 3 Encounters:  12/21/18 195 lb 8 oz (88.7 kg)  03/06/18 186 lb (84.4 kg)  02/20/18 190 lb 9.6 oz (86.5 kg)    BP 128/84   Pulse 70   Temp 97.8 F (36.6 C)   Ht 5\' 11"  (1.803 m)   Wt 195 lb 8 oz (88.7 kg)   SpO2 98%   BMI 27.27 kg/m    Physical Exam Constitutional:      Appearance: Normal appearance. He is not ill-appearing or diaphoretic.  HENT:     Right Ear: External ear normal.     Left Ear: External ear normal.     Nose: Nose normal.  Eyes:     General: No scleral icterus.    Extraocular Movements: Extraocular movements intact.     Conjunctiva/sclera: Conjunctivae normal.  Neck:     Musculoskeletal: Neck supple.  Cardiovascular:     Rate and Rhythm: Normal rate.  Pulmonary:     Effort: Pulmonary effort is normal.  Skin:    General: Skin is warm and dry.     Comments: Right hand with swelling and erythema over the distal palmar surface below the 2nd digit. Puncture wound present with purulent drainage. TTP, however unable to visualize for feel foreign body.   Neurological:     Mental Status: He is alert. Mental status is at baseline.  Psychiatric:        Mood and Affect: Mood normal.           Assessment & Plan:   Problem List Items  Addressed This Visit    None    Visit Diagnoses    Splinter of hand, right, initial encounter    -  Primary   Relevant Medications   cephALEXin (KEFLEX) 500 MG capsule   Other Relevant Orders   Ambulatory referral to Hand Surgery   WOUND CULTURE     Given skin erythema and delay in removal with drainage antibiotics provided.  Unable to visualize splinter to will refer to hand surgeon for evaluation and removal if it does not come out prior to appointment.   Wound culture to assess for infectious etiology  Return if symptoms worsen or fail to improve.  Lesleigh Noe, MD

## 2018-12-21 NOTE — Patient Instructions (Addendum)
We will collect the wound culture.   Hopefully the splinter will come out with time with the pus. You can also try soaking your hand in warm water which may help bring it out.   Take the antibiotic until you see the hand surgeon or until the splinter is removed. Or 7 days.    If you develop worsening pain, fevers, chills, severe swelling in the hand - call the clinic immediately as you may need emergency care.

## 2018-12-24 ENCOUNTER — Other Ambulatory Visit: Payer: Self-pay | Admitting: Orthopaedic Surgery

## 2018-12-24 DIAGNOSIS — L02511 Cutaneous abscess of right hand: Secondary | ICD-10-CM | POA: Diagnosis not present

## 2018-12-24 LAB — WOUND CULTURE
GRAM STAIN: NONE SEEN
MICRO NUMBER:: 91550909
RESULT: NO GROWTH
SPECIMEN QUALITY: ADEQUATE

## 2019-01-08 ENCOUNTER — Ambulatory Visit (INDEPENDENT_AMBULATORY_CARE_PROVIDER_SITE_OTHER): Payer: Medicare HMO | Admitting: Internal Medicine

## 2019-01-08 ENCOUNTER — Encounter: Payer: Self-pay | Admitting: Internal Medicine

## 2019-01-08 VITALS — BP 106/80 | HR 64 | Temp 97.5°F | Ht 71.0 in | Wt 191.0 lb

## 2019-01-08 DIAGNOSIS — N401 Enlarged prostate with lower urinary tract symptoms: Secondary | ICD-10-CM | POA: Diagnosis not present

## 2019-01-08 DIAGNOSIS — N138 Other obstructive and reflux uropathy: Secondary | ICD-10-CM

## 2019-01-08 DIAGNOSIS — Z0000 Encounter for general adult medical examination without abnormal findings: Secondary | ICD-10-CM

## 2019-01-08 DIAGNOSIS — Z7189 Other specified counseling: Secondary | ICD-10-CM | POA: Diagnosis not present

## 2019-01-08 LAB — CBC
HCT: 46.1 % (ref 39.0–52.0)
HEMOGLOBIN: 15.8 g/dL (ref 13.0–17.0)
MCHC: 34.2 g/dL (ref 30.0–36.0)
MCV: 91.1 fl (ref 78.0–100.0)
Platelets: 257 10*3/uL (ref 150.0–400.0)
RBC: 5.06 Mil/uL (ref 4.22–5.81)
RDW: 14.5 % (ref 11.5–15.5)
WBC: 5.1 10*3/uL (ref 4.0–10.5)

## 2019-01-08 LAB — COMPREHENSIVE METABOLIC PANEL
ALT: 11 U/L (ref 0–53)
AST: 17 U/L (ref 0–37)
Albumin: 4.3 g/dL (ref 3.5–5.2)
Alkaline Phosphatase: 42 U/L (ref 39–117)
BUN: 19 mg/dL (ref 6–23)
CHLORIDE: 104 meq/L (ref 96–112)
CO2: 28 mEq/L (ref 19–32)
Calcium: 9 mg/dL (ref 8.4–10.5)
Creatinine, Ser: 0.95 mg/dL (ref 0.40–1.50)
GFR: 78.37 mL/min (ref >60.00)
Glucose, Bld: 96 mg/dL (ref 70–99)
Potassium: 4.6 mEq/L (ref 3.5–5.1)
Sodium: 139 mEq/L (ref 135–145)
Total Bilirubin: 1.3 mg/dL — ABNORMAL HIGH (ref 0.2–1.2)
Total Protein: 6.6 g/dL (ref 6.0–8.3)

## 2019-01-08 LAB — PSA, MEDICARE: PSA: 1.73 ng/ml (ref 0.10–4.00)

## 2019-01-08 NOTE — Assessment & Plan Note (Signed)
Ongoing mild symptoms If worsens, would try finasteride this time

## 2019-01-08 NOTE — Patient Instructions (Signed)
Please ask your pharmacist about the shingrix vaccine.

## 2019-01-08 NOTE — Progress Notes (Signed)
Subjective:    Patient ID: Louis Anderson, male    DOB: 03-Dec-1951, 68 y.o.   MRN: [id number]  HPI Here for Medicare wellness visit and follow up of chronic health conditions Reviewed form and advanced directives Reviewed other doctors Still enjoys red wine with dinner---pretty much every day No tobacco Exercises regularly No falls No depression or anhedonia Vision is fine Has hearing aides bilaterally--some help Independent with instrumental ADLs No sig memory problems  Had colonoscopy last year Needs recall this year due to poor prep, etc  Still some urinary urgency Flow is not great Didn't really like the tamsulosin  No current outpatient medications on file prior to visit.   No current facility-administered medications on file prior to visit.     No Known Allergies  Past Medical History:  Diagnosis Date  . BPH (benign prostatic hypertrophy)   . Colon polyp     Past Surgical History:  Procedure Laterality Date  . APPENDECTOMY    . COLONOSCOPY    . INGUINAL HERNIA REPAIR  12/21/10   left  . POLYPECTOMY      Family History  Problem Relation Age of Onset  . Diabetes Father   . Diabetes Brother   . Colon cancer Cousin   . Colon polyps Neg Hx   . Esophageal cancer Neg Hx   . Rectal cancer Neg Hx   . Stomach cancer Neg Hx     Social History   Socioeconomic History  . Marital status: Married    Spouse name: Not on file  . Number of children: 2  . Years of education: Not on file  . Highest education level: Not on file  Occupational History  . 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 Financial resource strain: Not on file  . Food insecurity:    Worry: Not on file    Inability: Not on file  . Transportation needs:    Medical: Not on file    Non-medical: Not on file  Tobacco Use  . Smoking status: Never Smoker  . Smokeless tobacco: Never Used  Substance and Sexual Activity  . Alcohol use: Yes    Alcohol/week: 7.0  standard drinks    Types: 7 Standard drinks or equivalent per week    Comment: 2 glasss of wine a day  . Drug use: No  . Sexual activity: Not on file  Lifestyle  . Physical activity:    Days per week: Not on file    Minutes per session: Not on file  . Stress: Not on file  Relationships  . Social connections:    Talks on phone: Not on file    Gets together: Not on file    Attends religious service: Not on file    Active member of club or organization: Not on file    Attends meetings of clubs or organizations: Not on file    Relationship status: Not on file  . Intimate partner violence:    Fear of current or ex partner: Not on file    Emotionally abused: Not on file    Physically abused: Not on file    Forced sexual activity: Not on file  Other Topics Concern  . Not on file  Social History Narrative   Has living will    Wife is health care POA   Would accept resuscitation but no prolonged ventilation   No tube feeds if cognitively unaware   Review of Systems Appetite  is good Weight is fairly stable Sleep is variable. Trouble reinitiating if he awakens. No sig daytime somnolence Wears seat belt Teeth are okay---keeps up with dentist No rash or suspicious skin lesions No sig back or joint pain Rare heartburn. No dysphagia Bowels are fine. No blood No chest pain or SOB No palpitations No dizziness or syncope No edema    Objective:   Physical Exam  Constitutional: He is oriented to person, place, and time. He appears well-developed. No distress.  HENT:  Mouth/Throat: Oropharynx is clear and moist. No oropharyngeal exudate.  Neck: No thyromegaly present.  Cardiovascular: Normal rate, regular rhythm, normal heart sounds and intact distal pulses. Exam reveals no gallop.  No murmur heard. Respiratory: Effort normal and breath sounds normal. No respiratory distress. He has no wheezes. He has no rales.  GI: Soft. There is no abdominal tenderness.  Musculoskeletal:         General: No tenderness or edema.  Lymphadenopathy:    He has no cervical adenopathy.  Neurological: He is alert and oriented to person, place, and time.  President---"Trump, Obama, Bush" [id number] D-[id number] Recall 3/3  Skin: No rash noted. No erythema.  Psychiatric: He has a normal mood and affect. His behavior is normal.           Assessment & Plan:

## 2019-01-08 NOTE — Assessment & Plan Note (Signed)
See social history 

## 2019-01-08 NOTE — Progress Notes (Signed)
Visual Acuity Screening   Right eye Left eye Both eyes  Without correction:     With correction: 20/20 20/20 20/20   Hearing Screening Comments: Has hearing aids. Wearing them today.

## 2019-01-08 NOTE — Assessment & Plan Note (Signed)
I have personally reviewed the Medicare Annual Wellness questionnaire and have noted 1. The patient's medical and social history 2. Their use of alcohol, tobacco or illicit drugs 3. Their current medications and supplements 4. The patient's functional ability including ADL's, fall risks, home safety risks and hearing or visual             impairment. 5. Diet and physical activities 6. Evidence for depression or mood disorders  The patients weight, height, BMI and visual acuity have been recorded in the chart I have made referrals, counseling and provided education to the patient based review of the above and I have provided the pt with a written personalized care plan for preventive services.  I have provided you with a copy of your personalized plan for preventive services. Please take the time to review along with your updated medication list.  Yearly flu vaccine He will look into the shingrix Needs another colon in March Discussed PSA---will check one last one Stays fit

## 2019-03-18 ENCOUNTER — Encounter: Payer: Self-pay | Admitting: Gastroenterology

## 2019-05-18 ENCOUNTER — Encounter: Payer: Self-pay | Admitting: Gastroenterology

## 2019-05-24 HISTORY — PX: COLONOSCOPY: SHX174

## 2019-05-31 ENCOUNTER — Ambulatory Visit: Payer: Medicare HMO

## 2019-05-31 ENCOUNTER — Other Ambulatory Visit: Payer: Self-pay

## 2019-05-31 VITALS — Ht 71.0 in | Wt 187.0 lb

## 2019-05-31 DIAGNOSIS — Z86010 Personal history of colonic polyps: Secondary | ICD-10-CM

## 2019-05-31 MED ORDER — PEG-KCL-NACL-NASULF-NA ASC-C 140 G PO SOLR: 1.0000 | Freq: Once | ORAL | Status: AC

## 2019-05-31 NOTE — Progress Notes (Signed)
Per pt, no allergies to soy or egg products.Pt not taking any weight loss meds or using  O2 at home.  Pt refused emmi video.  The PV was done over the phone due to COVID-19. I verified the pt's address and insurance with him. Reviewed prep instructions and medical hx with the pt and will mail paperwork to the pt today. The pt was informed to call with any questions or changes prior to the procedure. He understood.

## 2019-06-11 ENCOUNTER — Telehealth: Payer: Self-pay | Admitting: Gastroenterology

## 2019-06-11 NOTE — Telephone Encounter (Signed)
Left message to call back to ask Covid-19 screening questions.  Covid-19 Screening Questions:   Do you now or have you had a fever in the last 14 days? No  Do you have any respiratory symptoms of shortness of breath or cough now or in the last 14 days? No   Do you have any family members or close contacts with diagnosed or suspected Covid-19 in the past 14 days? No  Have you been tested for Covid-19 and found to be positive? No  Pt made aware of that care partner may wait in the car or come up to the lobby during the procedure but will need to provide their own mask.

## 2019-06-14 ENCOUNTER — Ambulatory Visit (AMBULATORY_SURGERY_CENTER): Payer: Medicare HMO | Admitting: Gastroenterology

## 2019-06-14 ENCOUNTER — Other Ambulatory Visit: Payer: Self-pay

## 2019-06-14 ENCOUNTER — Encounter: Payer: Self-pay | Admitting: Gastroenterology

## 2019-06-14 VITALS — BP 137/80 | HR 60 | Temp 97.9°F | Resp 14 | Ht 71.0 in | Wt 187.0 lb

## 2019-06-14 DIAGNOSIS — D122 Benign neoplasm of ascending colon: Secondary | ICD-10-CM

## 2019-06-14 DIAGNOSIS — D120 Benign neoplasm of cecum: Secondary | ICD-10-CM

## 2019-06-14 DIAGNOSIS — Z860100 Personal history of colon polyps, unspecified: Secondary | ICD-10-CM

## 2019-06-14 MED ORDER — SODIUM CHLORIDE 0.9 % IV SOLN: 500.0000 mL | INTRAVENOUS | Status: DC

## 2019-06-14 NOTE — Progress Notes (Signed)
VS by Hiram Comber

## 2019-06-14 NOTE — Patient Instructions (Signed)
Handout given for polyps  Clip x1, wallet card given, keep with you for 2 months, clip should slough off in 1-2 weeks.  YOU HAD AN ENDOSCOPIC PROCEDURE TODAY AT [facility]:   Refer to the procedure report that was given to you for any specific questions about what was found during the examination.  If the procedure report does not answer your questions, please call your gastroenterologist to clarify.  If you requested that your care partner not be given the details of your procedure findings, then the procedure report has been included in a sealed envelope for you to review at your convenience later.  YOU SHOULD EXPECT: Some feelings of bloating in the abdomen. Passage of more gas than usual.  Walking can help get rid of the air that was put into your GI tract during the procedure and reduce the bloating. If you had a lower endoscopy (such as a colonoscopy or flexible sigmoidoscopy) you may notice spotting of blood in your stool or on the toilet paper. If you underwent a bowel prep for your procedure, you may not have a normal bowel movement for a few days.  Please Note:  You might notice some irritation and congestion in your nose or some drainage.  This is from the oxygen used during your procedure.  There is no need for concern and it should clear up in a day or so.  SYMPTOMS TO REPORT IMMEDIATELY:   Following lower endoscopy (colonoscopy or flexible sigmoidoscopy):  Excessive amounts of blood in the stool  Significant tenderness or worsening of abdominal pains  Swelling of the abdomen that is new, acute  Fever of 100F or higher   For urgent or emergent issues, a gastroenterologist can be reached at any hour by calling [phone].   DIET:  We do recommend a small meal at first, but then you may proceed to your regular diet.  Drink plenty of fluids but you should avoid alcoholic beverages for 24 hours.  ACTIVITY:  You should plan to take it easy for the rest of today  and you should NOT DRIVE or use heavy machinery until tomorrow (because of the sedation medicines used during the test).    FOLLOW UP: Our staff will call the number listed on your records 48-72 hours following your procedure to check on you and address any questions or concerns that you may have regarding the information given to you following your procedure. If we do not reach you, we will leave a message.  We will attempt to reach you two times.  During this call, we will ask if you have developed any symptoms of COVID 19. If you develop any symptoms (ie: fever, flu-like symptoms, shortness of breath, cough etc.) before then, please call [phone].  If you test positive for Covid 19 in the 2 weeks post procedure, please call and report this information to [place].    If any biopsies were taken you will be contacted by phone or by letter within the next 1-3 weeks.  Please call us at [phone] if you have not heard about the biopsies in 3 weeks.    SIGNATURES/CONFIDENTIALITY: You and/or your care partner have signed paperwork which will be entered into your electronic medical record.  These signatures attest to the fact that that the information above on your After Visit Summary has been reviewed and is understood.  Full responsibility of the confidentiality of this discharge information lies with you and/or your care-partner.

## 2019-06-14 NOTE — Progress Notes (Signed)
Called to room to assist during endoscopic procedure.  Patient ID and intended procedure confirmed with present staff. Received instructions for my participation in the procedure from the performing physician.  

## 2019-06-14 NOTE — Progress Notes (Signed)
Report given to PACU, vss 

## 2019-06-14 NOTE — Op Note (Addendum)
[facility] Patient Name: Louis Anderson Procedure Date: 06/14/2019 8:11 AM MRN: [id number] Endoscopist: Mallie Mussel L. Loletha Carrow , MD Age: 68 Referring MD:  Date of Birth: 21-Apr-1951 Gender: Male Account #: [id number] Procedure:                Colonoscopy Indications:              Surveillance: History of adenomatous and serrated                            polyps, inadequate prep on last exam (<52yr),                            (02/2018 screening colonoscopy with poor prep) Medicines:                Monitored Anesthesia Care Procedure:                Pre-Anesthesia Assessment:                           - Prior to the procedure, a History and Physical                            was performed, and patient medications and                            allergies were reviewed. The patient's tolerance of                            previous anesthesia was also reviewed. The risks                            and benefits of the procedure and the sedation                            options and risks were discussed with the patient.                            All questions were answered, and informed consent                            was obtained. Prior Anticoagulants: The patient has                            taken no previous anticoagulant or antiplatelet                            agents. ASA Grade Assessment: I - A normal, healthy                            patient. After reviewing the risks and benefits,                            the patient was deemed in satisfactory condition to  undergo the procedure.                           After obtaining informed consent, the colonoscope                            was passed under direct vision. Throughout the                            procedure, the patient's blood pressure, pulse, and                            oxygen saturations were monitored continuously. The                            Colonoscope was introduced through  the anus and                            advanced to the the terminal ileum, with                            identification of the appendiceal orifice and IC                            valve. The colonoscopy was somewhat difficult due                            to a redundant colon. Successful completion of the                            procedure was aided by placing an abdominal binder                            prior to the procedure (see 2019 report), and using                            manual pressure. The patient tolerated the                            procedure well. The quality of the bowel                            preparation was initially fair, then improved to                            good after lavage . The terminal ileum, ileocecal                            valve, appendiceal orifice, and rectum were                            photographed. The bowel preparation used was 2 day  Suprep/Miralax via split dose instruction. Scope In: 8:19:24 AM Scope Out: 9:06:11 AM Scope Withdrawal Time: 0 hours 38 minutes 5 seconds  Total Procedure Duration: 0 hours 46 minutes 47 seconds  Findings:                 The perianal and digital rectal examinations were                            normal.                           A 8 mm polyp was found in the cecum. The polyp was                            semi-sessile with a mucus cap (probable SSP by WL                            and NBI). The polyp was removed with a hot snare.                            Resection and retrieval were complete.                           A 20 mm polyp was found in the ascending colon. The                            polyp was flat. Preparations were made for mucosal                            resection. 3cc Saline was injected to raise the                            lesion. Piecemeal snare mucosal resection was                            performed. Resection was complete, and retrieval                             was complete. To prevent bleeding                            post-intervention, one hemostatic clip was                            successfully placed (MR conditional). There was no                            bleeding at the end of the procedure. Area was                            tattooed with an injection of 0.5 mL of Spot                            (carbon  black).                           The colon (entire examined portion) was                            significantly redundant.                           The exam was otherwise without abnormality on                            direct and retroflexion views. Complications:            No immediate complications. Estimated Blood Loss:     Estimated blood loss was minimal. Impression:               - One 8 mm polyp in the cecum, removed with a hot                            snare. Resected and retrieved.                           - One 20 mm polyp in the ascending colon, removed                            with mucosal resection. Resected and retrieved.                            Clip (MR conditional) was placed. Tattooed.                           - Redundant colon.                           - The examination was otherwise normal on direct                            and retroflexion views.                           - Mucosal resection was performed. Resection was                            complete, and retrieval was complete. Recommendation:           - Patient has a contact number available for                            emergencies. The signs and symptoms of potential                            delayed complications were discussed with the                            patient. Return to normal activities tomorrow.  Written discharge instructions were provided to the                            patient.                           - Resume previous diet.                           -  Continue present medications.                           - Await pathology results.                           - Repeat colonoscopy in 1 year for surveillance. Henry L. Loletha Carrow, MD 06/14/2019 9:18:37 AM This report has been signed electronically.

## 2019-06-16 ENCOUNTER — Telehealth: Payer: Self-pay | Admitting: *Deleted

## 2019-06-16 NOTE — Telephone Encounter (Signed)
  Follow up Call-  Call back number 06/14/2019 03/06/2018  Post procedure Call Back phone  # [phone]  Permission to leave phone message Yes Yes  Some recent data might be hidden     Patient questions:  Do you have a fever, pain , or abdominal swelling? No. Pain Score  0 *  Have you tolerated food without any problems? Yes.    Have you been able to return to your normal activities? Yes.    Do you have any questions about your discharge instructions: Diet   No. Medications  No. Follow up visit  No.  Do you have questions or concerns about your Care? No.  Actions: * If pain score is 4 or above: No action needed, pain <4.

## 2019-06-16 NOTE — Telephone Encounter (Signed)
1. Have you developed a fever since your procedure? no  2.   Have you had an respiratory symptoms (SOB or cough) since your procedure? no  3.   Have you tested positive for COVID 19 since your procedure no  4.   Have you had any family members/close contacts diagnosed with the COVID 19 since your procedure?  no   If yes to any of these questions please route to Joylene John, RN and Alphonsa Gin, Therapist, sports.

## 2019-06-17 ENCOUNTER — Encounter: Payer: Self-pay | Admitting: Gastroenterology

## 2019-07-08 DIAGNOSIS — H52203 Unspecified astigmatism, bilateral: Secondary | ICD-10-CM | POA: Diagnosis not present

## 2019-07-08 DIAGNOSIS — H43813 Vitreous degeneration, bilateral: Secondary | ICD-10-CM | POA: Diagnosis not present

## 2019-07-08 DIAGNOSIS — H5213 Myopia, bilateral: Secondary | ICD-10-CM | POA: Diagnosis not present

## 2019-07-08 DIAGNOSIS — H2513 Age-related nuclear cataract, bilateral: Secondary | ICD-10-CM | POA: Diagnosis not present

## 2019-10-07 ENCOUNTER — Ambulatory Visit (INDEPENDENT_AMBULATORY_CARE_PROVIDER_SITE_OTHER): Payer: Medicare HMO

## 2019-10-07 DIAGNOSIS — Z23 Encounter for immunization: Secondary | ICD-10-CM | POA: Diagnosis not present

## 2020-01-14 ENCOUNTER — Encounter: Payer: Medicare HMO | Admitting: Internal Medicine

## 2020-01-26 ENCOUNTER — Ambulatory Visit: Payer: Medicare HMO | Admitting: Internal Medicine

## 2020-01-28 ENCOUNTER — Encounter: Payer: Self-pay | Admitting: Internal Medicine

## 2020-01-28 ENCOUNTER — Ambulatory Visit (INDEPENDENT_AMBULATORY_CARE_PROVIDER_SITE_OTHER): Payer: Medicare HMO | Admitting: Internal Medicine

## 2020-01-28 ENCOUNTER — Other Ambulatory Visit: Payer: Self-pay

## 2020-01-28 VITALS — BP 116/74 | HR 58 | Temp 97.0°F | Ht 71.0 in | Wt 191.0 lb

## 2020-01-28 DIAGNOSIS — N401 Enlarged prostate with lower urinary tract symptoms: Secondary | ICD-10-CM | POA: Diagnosis not present

## 2020-01-28 DIAGNOSIS — N138 Other obstructive and reflux uropathy: Secondary | ICD-10-CM | POA: Diagnosis not present

## 2020-01-28 DIAGNOSIS — Z0000 Encounter for general adult medical examination without abnormal findings: Secondary | ICD-10-CM | POA: Diagnosis not present

## 2020-01-28 DIAGNOSIS — Z7189 Other specified counseling: Secondary | ICD-10-CM

## 2020-01-28 LAB — COMPREHENSIVE METABOLIC PANEL
ALT: 17 U/L (ref 0–53)
AST: 19 U/L (ref 0–37)
Albumin: 4.3 g/dL (ref 3.5–5.2)
Alkaline Phosphatase: 47 U/L (ref 39–117)
BUN: 16 mg/dL (ref 6–23)
CO2: 28 mEq/L (ref 19–32)
Calcium: 8.9 mg/dL (ref 8.4–10.5)
Chloride: 102 mEq/L (ref 96–112)
Creatinine, Ser: 0.95 mg/dL (ref 0.40–1.50)
GFR: 78.13 mL/min (ref >60.00)
Glucose, Bld: 95 mg/dL (ref 70–99)
Potassium: 4.4 mEq/L (ref 3.5–5.1)
Sodium: 137 mEq/L (ref 135–145)
Total Bilirubin: 1.1 mg/dL (ref 0.2–1.2)
Total Protein: 6.8 g/dL (ref 6.0–8.3)

## 2020-01-28 LAB — CBC
HCT: 45.5 % (ref 39.0–52.0)
Hemoglobin: 15.5 g/dL (ref 13.0–17.0)
MCHC: 34 g/dL (ref 30.0–36.0)
MCV: 92.1 fl (ref 78.0–100.0)
Platelets: 244 10*3/uL (ref 150.0–400.0)
RBC: 4.94 Mil/uL (ref 4.22–5.81)
RDW: 13.3 % (ref 11.5–15.5)
WBC: 6.8 10*3/uL (ref 4.0–10.5)

## 2020-01-28 NOTE — Assessment & Plan Note (Signed)
See social history 

## 2020-01-28 NOTE — Progress Notes (Signed)
Subjective:    Patient ID: Louis Anderson, male    DOB: 1951-08-03, 69 y.o.   MRN: [id number]  HPI Here for Medicare wellness visit and follow up of chronic health conditions This visit occurred during the SARS-CoV-2 public health emergency.  Safety protocols were in place, including screening questions prior to the visit, additional usage of staff PPE, and extensive cleaning of exam room while observing appropriate contact time as indicated for disinfecting solutions.   Reviewed form and advanced directives Reviewed other doctors Still enjoys red wine with dinner No tobacco Stays active--regular exercise No falls No depression or anhedonia Vision is okay Independent with instrumental ADLs No sig memory problems  Has ringing in ears Notices it buzzing at times but often more like birds Has hearing aides--but they don't help much No vertigo  Gets some swelling at times on left buttock cheek Slight burning at times No drainage No real pain  Voids okay--still with frequency Nocturia x 3 or more Sleeps okay despite this Flow is okay in day--but has to go every hour  No current outpatient medications on file prior to visit.   No current facility-administered medications on file prior to visit.    No Known Allergies  Past Medical History:  Diagnosis Date  . BPH (benign prostatic hypertrophy)   . Colon polyp     Past Surgical History:  Procedure Laterality Date  . APPENDECTOMY    . COLONOSCOPY    . INGUINAL HERNIA REPAIR  12/21/10   left  . POLYPECTOMY      Family History  Problem Relation Age of Onset  . Diabetes Father   . Diabetes Brother   . Colon cancer Cousin   . Colon polyps Neg Hx   . Esophageal cancer Neg Hx   . Rectal cancer Neg Hx   . Stomach cancer Neg Hx     Social History   Socioeconomic History  . Marital status: Married    Spouse name: Not on file  . Number of children: 2  . Years of education: Not on file  . Highest education  level: Not on file  Occupational History  . Occupation: Associate Professor    Comment: Retired  Tobacco Use  . Smoking status: Never Smoker  . Smokeless tobacco: Never Used  Substance and Sexual Activity  . Alcohol use: Yes    Alcohol/week: 14.0 standard drinks    Types: 14 Glasses of wine per week    Comment: 2 glasss of wine a day  . Drug use: No  . Sexual activity: Not on file  Other Topics Concern  . Not on file  Social History Narrative   Has living will    Wife is health care POA. Alternate is daughter   Would accept resuscitation but no prolonged ventilation   No tube feeds if cognitively unaware   Social Determinants of Health   Financial Resource Strain:   . Difficulty of Paying Living Expenses: Not on file  Food Insecurity:   . Worried About Charity fundraiser in the Last Year: Not on file  . Ran Out of Food in the Last Year: Not on file  Transportation Needs:   . Lack of Transportation (Medical): Not on file  . Lack of Transportation (Non-Medical): Not on file  Physical Activity:   . Days of Exercise per Week: Not on file  . Minutes of Exercise per Session: Not on file  Stress:   . Feeling of Stress : Not on  file  Social Connections:   . Frequency of Communication with Friends and Family: Not on file  . Frequency of Social Gatherings with Friends and Family: Not on file  . Attends Religious Services: Not on file  . Active Member of Clubs or Organizations: Not on file  . Attends Archivist Meetings: Not on file  . Marital Status: Not on file  Intimate Partner Violence:   . Fear of Current or Ex-Partner: Not on file  . Emotionally Abused: Not on file  . Physically Abused: Not on file  . Sexually Abused: Not on file   Review of Systems Appetite is good Weight fairly stable Wears seat belt Teeth fine--keeps up with dentist No suspicious skin lesions Rare heartburn--tums will help. Swallows okay Bowels are okay--variable. No blood No sig back or  joint pains No chest pain or SOB No dizziness or syncope No edema    Objective:   Physical Exam  Constitutional: He is oriented to person, place, and time. He appears well-developed. No distress.  HENT:  Mouth/Throat: Oropharynx is clear and moist. No oropharyngeal exudate.  Neck: No thyromegaly present.  Cardiovascular: Normal rate, regular rhythm, normal heart sounds and intact distal pulses. Exam reveals no gallop.  No murmur heard. Respiratory: Effort normal and breath sounds normal. No respiratory distress. He has no wheezes. He has no rales.  GI: Soft. There is no abdominal tenderness.  Musculoskeletal:        General: No tenderness or edema.  Lymphadenopathy:    He has no cervical adenopathy.  Neurological: He is alert and oriented to person, place, and time.  President--- "Zoila Shutter, Obama" [id number] D-l-r-o-w Recall 3/3  Skin: No rash noted. No erythema.  Psychiatric: He has a normal mood and affect. His behavior is normal.           Assessment & Plan:

## 2020-01-28 NOTE — Assessment & Plan Note (Signed)
I have personally reviewed the Medicare Annual Wellness questionnaire and have noted 1. The patient's medical and social history 2. Their use of alcohol, tobacco or illicit drugs 3. Their current medications and supplements 4. The patient's functional ability including ADL's, fall risks, home safety risks and hearing or visual             impairment. 5. Diet and physical activities 6. Evidence for depression or mood disorders  The patients weight, height, BMI and visual acuity have been recorded in the chart I have made referrals, counseling and provided education to the patient based review of the above and I have provided the pt with a written personalized care plan for preventive services.  I have provided you with a copy of your personalized plan for preventive services. Please take the time to review along with your updated medication list.  COVID vaccine when available Yearly flu vaccine No more PSA testing due to age Colon due again in June Stays in shape

## 2020-01-28 NOTE — Progress Notes (Signed)
Hearing Screening   125Hz  250Hz  500Hz  1000Hz  2000Hz  3000Hz  4000Hz  6000Hz  8000Hz   Right ear:           Left ear:           Comments: Has hearing aids. Wearing them today.  Vision Screening Comments: August 2020

## 2020-01-28 NOTE — Assessment & Plan Note (Signed)
Has significant symptoms Didn't like tamsulosin---caused rhinorrhea Wants to hold off on anything else---but if he changes his mind, will try finasteride

## 2020-05-29 ENCOUNTER — Encounter: Payer: Self-pay | Admitting: Gastroenterology

## 2020-06-08 ENCOUNTER — Other Ambulatory Visit: Payer: Self-pay

## 2020-06-08 ENCOUNTER — Ambulatory Visit (AMBULATORY_SURGERY_CENTER): Payer: Self-pay | Admitting: *Deleted

## 2020-06-08 VITALS — Ht 71.0 in | Wt 198.4 lb

## 2020-06-08 DIAGNOSIS — Z800 Family history of malignant neoplasm of digestive organs: Secondary | ICD-10-CM

## 2020-06-08 DIAGNOSIS — Z01818 Encounter for other preprocedural examination: Secondary | ICD-10-CM

## 2020-06-08 DIAGNOSIS — Z86010 Personal history of colonic polyps: Secondary | ICD-10-CM

## 2020-06-08 MED ORDER — SUPREP BOWEL PREP KIT 17.5-3.13-1.6 GM/177ML PO SOLN: 1.0000 | Freq: Once | ORAL | 0 refills | Status: AC

## 2020-06-08 NOTE — Progress Notes (Signed)
Patient denies any allergies to egg or soy products. Patient denies complications with anesthesia/sedation.  Patient denies oxygen use at home and denies diet medications. Patient denies information on Colonoscopy procedure.

## 2020-06-16 ENCOUNTER — Ambulatory Visit (INDEPENDENT_AMBULATORY_CARE_PROVIDER_SITE_OTHER): Payer: Medicare HMO

## 2020-06-16 ENCOUNTER — Other Ambulatory Visit: Payer: Self-pay | Admitting: Gastroenterology

## 2020-06-16 DIAGNOSIS — Z1159 Encounter for screening for other viral diseases: Secondary | ICD-10-CM | POA: Diagnosis not present

## 2020-06-19 LAB — SARS CORONAVIRUS 2 (TAT 6-24 HRS): SARS Coronavirus 2: NEGATIVE

## 2020-06-21 ENCOUNTER — Encounter: Payer: Self-pay | Admitting: Gastroenterology

## 2020-06-21 ENCOUNTER — Ambulatory Visit (AMBULATORY_SURGERY_CENTER): Payer: Medicare HMO | Admitting: Gastroenterology

## 2020-06-21 ENCOUNTER — Other Ambulatory Visit: Payer: Self-pay

## 2020-06-21 VITALS — BP 120/56 | HR 60 | Temp 97.3°F | Resp 13 | Ht 71.0 in | Wt 198.0 lb

## 2020-06-21 DIAGNOSIS — D123 Benign neoplasm of transverse colon: Secondary | ICD-10-CM | POA: Diagnosis not present

## 2020-06-21 DIAGNOSIS — D122 Benign neoplasm of ascending colon: Secondary | ICD-10-CM

## 2020-06-21 DIAGNOSIS — Z86010 Personal history of colonic polyps: Secondary | ICD-10-CM | POA: Diagnosis not present

## 2020-06-21 HISTORY — PX: COLONOSCOPY WITH PROPOFOL: SHX5780

## 2020-06-21 MED ORDER — SODIUM CHLORIDE 0.9 % IV SOLN: 500.0000 mL | Freq: Once | INTRAVENOUS | Status: DC

## 2020-06-21 NOTE — Progress Notes (Signed)
Called to room to assist during endoscopic procedure.  Patient ID and intended procedure confirmed with present staff. Received instructions for my participation in the procedure from the performing physician.  

## 2020-06-21 NOTE — Progress Notes (Signed)
Pt's states no medical or surgical changes since previsit or office visit.  CW - vitals 

## 2020-06-21 NOTE — Patient Instructions (Signed)
YOU HAD AN ENDOSCOPIC PROCEDURE TODAY AT THE [facility]:   Refer to the procedure report that was given to you for any specific questions about what was found during the examination.  If the procedure report does not answer your questions, please call your gastroenterologist to clarify.  If you requested that your care partner not be given the details of your procedure findings, then the procedure report has been included in a sealed envelope for you to review at your convenience later.  YOU SHOULD EXPECT: Some feelings of bloating in the abdomen. Passage of more gas than usual.  Walking can help get rid of the air that was put into your GI tract during the procedure and reduce the bloating. If you had a lower endoscopy (such as a colonoscopy or flexible sigmoidoscopy) you may notice spotting of blood in your stool or on the toilet paper. If you underwent a bowel prep for your procedure, you may not have a normal bowel movement for a few days.  Please Note:  You might notice some irritation and congestion in your nose or some drainage.  This is from the oxygen used during your procedure.  There is no need for concern and it should clear up in a day or so.  SYMPTOMS TO REPORT IMMEDIATELY:   Following lower endoscopy (colonoscopy or flexible sigmoidoscopy):  Excessive amounts of blood in the stool  Significant tenderness or worsening of abdominal pains  Swelling of the abdomen that is new, acute  Fever of 100F or higher  For urgent or emergent issues, a gastroenterologist can be reached at any hour by calling [phone]. Do not use MyChart messaging for urgent concerns.    DIET:  We do recommend a small meal at first, but then you may proceed to your regular diet.  Drink plenty of fluids but you should avoid alcoholic beverages for 24 hours.  ACTIVITY:  You should plan to take it easy for the rest of today and you should NOT DRIVE or use heavy machinery until tomorrow (because  of the sedation medicines used during the test).    FOLLOW UP: Our staff will call the number listed on your records 48-72 hours following your procedure to check on you and address any questions or concerns that you may have regarding the information given to you following your procedure. If we do not reach you, we will leave a message.  We will attempt to reach you two times.  During this call, we will ask if you have developed any symptoms of COVID 19. If you develop any symptoms (ie: fever, flu-like symptoms, shortness of breath, cough etc.) before then, please call [phone].  If you test positive for Covid 19 in the 2 weeks post procedure, please call and report this information to us.    If any biopsies were taken you will be contacted by phone or by letter within the next 1-3 weeks.  Please call us at [phone] if you have not heard about the biopsies in 3 weeks.    SIGNATURES/CONFIDENTIALITY: You and/or your care partner have signed paperwork which will be entered into your electronic medical record.  These signatures attest to the fact that that the information above on your After Visit Summary has been reviewed and is understood.  Full responsibility of the confidentiality of this discharge information lies with you and/or your care-partner. 

## 2020-06-21 NOTE — Progress Notes (Signed)
pt tolerated well. VSS. awake and to recovery. Report given to RN.  

## 2020-06-21 NOTE — Op Note (Signed)
[place] Patient Name: Louis Anderson Procedure Date: 06/21/2020 2:16 PM MRN: [id number] Endoscopist: Mallie Mussel L. Loletha Carrow , MD Age: 69 Referring MD:  Date of Birth: 1951/03/09 Gender: Male Account #: [id number] Procedure:                Colonoscopy Indications:              Increased risk colon cancer surveillance: Personal                            history of adenoma (10 mm or greater in size) - 24mm                            SSP and 2cm Ascending colon TA 05/2019) Medicines:                Monitored Anesthesia Care Procedure:                Pre-Anesthesia Assessment:                           - Prior to the procedure, a History and Physical                            was performed, and patient medications and                            allergies were reviewed. The patient's tolerance of                            previous anesthesia was also reviewed. The risks                            and benefits of the procedure and the sedation                            options and risks were discussed with the patient.                            All questions were answered, and informed consent                            was obtained. Prior Anticoagulants: The patient has                            taken no previous anticoagulant or antiplatelet                            agents. ASA Grade Assessment: II - A patient with                            mild systemic disease. After reviewing the risks                            and benefits, the patient was deemed in  satisfactory condition to undergo the procedure.                           After obtaining informed consent, the colonoscope                            was passed under direct vision. Throughout the                            procedure, the patient's blood pressure, pulse, and                            oxygen saturations were monitored continuously. The                            Colonoscope was  introduced through the anus and                            advanced to the the cecum, identified by                            appendiceal orifice and ileocecal valve. The                            colonoscopy was somewhat difficult due to a                            redundant colon and significant looping. Successful                            completion of the procedure was aided by placement                            of abdominal binder prior to the case. The patient                            tolerated the procedure well. The quality of the                            bowel preparation was good after lavage. The                            ileocecal valve, appendiceal orifice, and rectum                            were photographed. The bowel preparation used was 2                            day Suprep/Miralax. Scope In: 2:37:30 PM Scope Out: 3:06:19 PM Scope Withdrawal Time: 0 hours 21 minutes 14 seconds  Total Procedure Duration: 0 hours 28 minutes 49 seconds  Findings:                 The perianal and digital rectal examinations were  normal.                           The colon (entire examined portion) revealed                            significantly excessive looping.                           Three sessile polyps were found in the proximal                            transverse colon and ascending colon. The polyps                            were diminutive in size. These polyps were removed                            with a cold snare. Resection and retrieval were                            complete.                           A tattoo was seen in the ascending colon. A scar                            (from prior polypectomy) was found adjacent to the                            tattoo site. No polyp was seen.                           A 8 mm polyp was found in the proximal transverse                            colon. The polyp was sessile. The polyp  was removed                            with a cold snare. Resection and retrieval were                            complete.                           The exam was otherwise without abnormality on                            direct and retroflexion views. Complications:            No immediate complications. Estimated Blood Loss:     Estimated blood loss was minimal. Impression:               - There was significant looping of the colon.                           -  Three diminutive polyps in the proximal                            transverse colon and in the ascending colon,                            removed with a cold snare. Resected and retrieved.                           - A tattoo was seen in the ascending colon. A scar                            was found at the tattoo site.                           - One 8 mm polyp in the proximal transverse colon,                            removed with a cold snare. Resected and retrieved.                           - The examination was otherwise normal on direct                            and retroflexion views. Recommendation:           - Patient has a contact number available for                            emergencies. The signs and symptoms of potential                            delayed complications were discussed with the                            patient. Return to normal activities tomorrow.                            Written discharge instructions were provided to the                            patient.                           - Resume previous diet.                           - Continue present medications.                           - Await pathology results.                           - Repeat colonoscopy is recommended for  surveillance. The colonoscopy date will be                            determined after pathology results from today's                            exam become available for  review. Janny Crute L. Loletha Carrow, MD 06/21/2020 3:15:45 PM This report has been signed electronically.

## 2020-06-23 ENCOUNTER — Telehealth: Payer: Self-pay

## 2020-06-23 NOTE — Telephone Encounter (Signed)
LVM

## 2020-06-23 NOTE — Telephone Encounter (Signed)
  Follow up Call-  Call back number 06/21/2020 06/14/2019 03/06/2018  Post procedure Call Back phone  # [phone]  Permission to leave phone message Yes Yes Yes  Some recent data might be hidden     Patient questions:  Do you have a fever, pain , or abdominal swelling? No. Pain Score  0 *  Have you tolerated food without any problems? Yes.    Have you been able to return to your normal activities? Yes.    Do you have any questions about your discharge instructions: Diet   No. Medications  No. Follow up visit  No.  Do you have questions or concerns about your Care? No.  Actions: * If pain score is 4 or above: No action needed, pain <4.  1. Have you developed a fever since your procedure? no  2.   Have you had an respiratory symptoms (SOB or cough) since your procedure? no  3.   Have you tested positive for COVID 19 since your procedure no  4.   Have you had any family members/close contacts diagnosed with the COVID 19 since your procedure?  no   If yes to any of these questions please route to Joylene John, RN and Erenest Rasher, RN

## 2020-06-27 ENCOUNTER — Encounter: Payer: Self-pay | Admitting: Gastroenterology

## 2020-09-28 DIAGNOSIS — H43813 Vitreous degeneration, bilateral: Secondary | ICD-10-CM | POA: Diagnosis not present

## 2020-09-28 DIAGNOSIS — H52203 Unspecified astigmatism, bilateral: Secondary | ICD-10-CM | POA: Diagnosis not present

## 2020-09-28 DIAGNOSIS — H5213 Myopia, bilateral: Secondary | ICD-10-CM | POA: Diagnosis not present

## 2020-09-28 DIAGNOSIS — H2513 Age-related nuclear cataract, bilateral: Secondary | ICD-10-CM | POA: Diagnosis not present

## 2021-01-29 ENCOUNTER — Encounter: Payer: Medicare HMO | Admitting: Internal Medicine

## 2021-01-31 ENCOUNTER — Ambulatory Visit (INDEPENDENT_AMBULATORY_CARE_PROVIDER_SITE_OTHER): Payer: Medicare HMO | Admitting: Internal Medicine

## 2021-01-31 ENCOUNTER — Encounter: Payer: Self-pay | Admitting: Internal Medicine

## 2021-01-31 ENCOUNTER — Other Ambulatory Visit: Payer: Self-pay

## 2021-01-31 VITALS — BP 116/84 | HR 69 | Temp 97.4°F | Ht 71.25 in | Wt 201.0 lb

## 2021-01-31 DIAGNOSIS — N138 Other obstructive and reflux uropathy: Secondary | ICD-10-CM

## 2021-01-31 DIAGNOSIS — K219 Gastro-esophageal reflux disease without esophagitis: Secondary | ICD-10-CM

## 2021-01-31 DIAGNOSIS — Z7189 Other specified counseling: Secondary | ICD-10-CM

## 2021-01-31 DIAGNOSIS — Z0000 Encounter for general adult medical examination without abnormal findings: Secondary | ICD-10-CM

## 2021-01-31 DIAGNOSIS — N401 Enlarged prostate with lower urinary tract symptoms: Secondary | ICD-10-CM | POA: Diagnosis not present

## 2021-01-31 LAB — LIPID PANEL
Cholesterol: 212 mg/dL — ABNORMAL HIGH (ref 0–200)
HDL: 72.7 mg/dL (ref >39.00)
LDL Cholesterol: 124 mg/dL — ABNORMAL HIGH (ref 0–99)
NonHDL: 139.37
Total CHOL/HDL Ratio: 3
Triglycerides: 77 mg/dL (ref 0.0–149.0)
VLDL: 15.4 mg/dL (ref 0.0–40.0)

## 2021-01-31 LAB — COMPREHENSIVE METABOLIC PANEL
ALT: 14 U/L (ref 0–53)
AST: 20 U/L (ref 0–37)
Albumin: 4.1 g/dL (ref 3.5–5.2)
Alkaline Phosphatase: 43 U/L (ref 39–117)
BUN: 14 mg/dL (ref 6–23)
CO2: 31 mEq/L (ref 19–32)
Calcium: 8.9 mg/dL (ref 8.4–10.5)
Chloride: 102 mEq/L (ref 96–112)
Creatinine, Ser: 0.99 mg/dL (ref 0.40–1.50)
GFR: 76.33 mL/min (ref >60.00)
Glucose, Bld: 102 mg/dL — ABNORMAL HIGH (ref 70–99)
Potassium: 4.5 mEq/L (ref 3.5–5.1)
Sodium: 138 mEq/L (ref 135–145)
Total Bilirubin: 0.8 mg/dL (ref 0.2–1.2)
Total Protein: 6.7 g/dL (ref 6.0–8.3)

## 2021-01-31 LAB — CBC
HCT: 46 % (ref 39.0–52.0)
Hemoglobin: 15.4 g/dL (ref 13.0–17.0)
MCHC: 33.5 g/dL (ref 30.0–36.0)
MCV: 92.1 fl (ref 78.0–100.0)
Platelets: 238 10*3/uL (ref 150.0–400.0)
RBC: 4.99 Mil/uL (ref 4.22–5.81)
RDW: 14.1 % (ref 11.5–15.5)
WBC: 5.8 10*3/uL (ref 4.0–10.5)

## 2021-01-31 NOTE — Assessment & Plan Note (Signed)
See social history 

## 2021-01-31 NOTE — Assessment & Plan Note (Signed)
Mild symptoms Not enough for medications as yet

## 2021-01-31 NOTE — Assessment & Plan Note (Signed)
I have personally reviewed the Medicare Annual Wellness questionnaire and have noted 1. The patient's medical and social history 2. Their use of alcohol, tobacco or illicit drugs 3. Their current medications and supplements 4. The patient's functional ability including ADL's, fall risks, home safety risks and hearing or visual             impairment. 5. Diet and physical activities 6. Evidence for depression or mood disorders  The patients weight, height, BMI and visual acuity have been recorded in the chart I have made referrals, counseling and provided education to the patient based review of the above and I have provided the pt with a written personalized care plan for preventive services.  I have provided you with a copy of your personalized plan for preventive services. Please take the time to review along with your updated medication list.  Due for COVID booster Prefers no flu vaccine this year Can consider shingrix--but had the old one Exercises regularly Colon due again next year (had serrated polyp) No PSA due to age

## 2021-01-31 NOTE — Assessment & Plan Note (Signed)
Mild Uses tums only

## 2021-01-31 NOTE — Progress Notes (Signed)
Subjective:    Patient ID: Louis Anderson, male    DOB: [birth year], 70 y.o.   MRN: [id number]  HPI Here for Medicare wellness visit and follow up of chronic health conditions This visit occurred during the SARS-CoV-2 public health emergency.  Safety protocols were in place, including screening questions prior to the visit, additional usage of staff PPE, and extensive cleaning of exam room while observing appropriate contact time as indicated for disinfecting solutions.   Reviewed form and advanced directives Reviewed other doctors Exercises regularly but less in the winter Drinks wine with dinner---and a whiskey No tobacco Vision is fine Hearing aides do help some--but not great No falls No depression or anhedonia Independent with instrumental ADLs No worrisome memory issues  Ongoing mild urinary issues Some urgency---mild reduced stream and dribbling Nocturia x 2 usually  Now has tendon nodule on right 2nd finger No restriction of motion No sig pain  No current outpatient medications on file prior to visit.   No current facility-administered medications on file prior to visit.    No Known Allergies  Past Medical History:  Diagnosis Date  . BPH (benign prostatic hypertrophy)   . Colon polyp   . Hard of hearing    wears hearing aids  . Wears glasses     Past Surgical History:  Procedure Laterality Date  . APPENDECTOMY    . COLONOSCOPY  05/2019   danis   ta polyps  . INGUINAL HERNIA REPAIR  12/21/10   left  . POLYPECTOMY      Family History  Problem Relation Age of Onset  . Diabetes Father   . Diabetes Brother   . Colon cancer Brother 56       dx in 2020, had surgery - no chemo/rad  . Colon cancer Cousin        first cousin  . Colon polyps Neg Hx   . Esophageal cancer Neg Hx   . Rectal cancer Neg Hx   . Stomach cancer Neg Hx     Social History   Socioeconomic History  . Marital status: Married    Spouse name: Not on file  . Number of children:  2  . Years of education: Not on file  . Highest education level: Not on file  Occupational History  . Occupation: Associate Professor    Comment: Retired  Tobacco Use  . Smoking status: Former Smoker    Packs/day: 0.50    Types: Cigarettes    Start date: 62    Quit date: 1978    Years since quitting: 44.1  . Smokeless tobacco: Never Used  Vaping Use  . Vaping Use: Never used  Substance and Sexual Activity  . Alcohol use: Yes    Alcohol/week: 15.0 standard drinks    Types: 14 Glasses of wine, 1 Shots of liquor per week    Comment: 2 glasss of wine a day  . Drug use: No  . Sexual activity: Yes  Other Topics Concern  . Not on file  Social History Narrative   Has living will    Wife is health care POA. Alternate is daughter   Would accept resuscitation but no prolonged ventilation   No tube feeds if cognitively unaware   Social Determinants of Health   Financial Resource Strain: Not on file  Food Insecurity: Not on file  Transportation Needs: Not on file  Physical Activity: Not on file  Stress: Not on file  Social Connections: Not on file  Intimate  Partner Violence: Not on file   Review of Systems Appetite is fine Weight up a few pounds Mild joint aches and pains--no meds needed Sleeps fairly well--occasional trouble getting back after nocturia No regular fatigue Wears seat belt Teeth are fine---sees dentist Has lesion on left forearm to be checked--doesn't see derm No chest pain or SOB No dizziness or syncope No edema Occasional heartburn---uses tums prn. No dysphagia Bowels are fine--no blood (occasional constipation)    Objective:   Physical Exam Constitutional:      Appearance: Normal appearance.  HENT:     Mouth/Throat:     Comments: No lesions Eyes:     Conjunctiva/sclera: Conjunctivae normal.     Pupils: Pupils are equal, round, and reactive to light.  Cardiovascular:     Rate and Rhythm: Normal rate and regular rhythm.     Pulses: Normal pulses.      Heart sounds: No murmur heard. No gallop.   Pulmonary:     Effort: Pulmonary effort is normal.     Breath sounds: Normal breath sounds. No wheezing or rales.  Abdominal:     Palpations: Abdomen is soft.     Tenderness: There is no abdominal tenderness.  Musculoskeletal:     Cervical back: Neck supple.     Right lower leg: No edema.     Left lower leg: No edema.  Lymphadenopathy:     Cervical: No cervical adenopathy.  Skin:    General: Skin is warm.     Findings: No rash.     Comments: Benign fleshy mole on left forearm  Neurological:     Mental Status: He is alert and oriented to person, place, and time.     Comments: President--- "Wendie Simmer--- Obama" [id number] D-l-r-o-w Recall 3/3  Psychiatric:        Mood and Affect: Mood normal.        Behavior: Behavior normal.            Assessment & Plan:

## 2021-10-04 DIAGNOSIS — H2513 Age-related nuclear cataract, bilateral: Secondary | ICD-10-CM | POA: Diagnosis not present

## 2021-10-04 DIAGNOSIS — H5213 Myopia, bilateral: Secondary | ICD-10-CM | POA: Diagnosis not present

## 2022-01-01 ENCOUNTER — Ambulatory Visit (INDEPENDENT_AMBULATORY_CARE_PROVIDER_SITE_OTHER): Payer: Medicare HMO | Admitting: Internal Medicine

## 2022-01-01 ENCOUNTER — Other Ambulatory Visit: Payer: Self-pay

## 2022-01-01 ENCOUNTER — Encounter: Payer: Self-pay | Admitting: Internal Medicine

## 2022-01-01 ENCOUNTER — Encounter: Payer: Self-pay | Admitting: *Deleted

## 2022-01-01 VITALS — BP 122/80 | HR 69 | Temp 97.3°F | Ht 71.5 in | Wt 199.0 lb

## 2022-01-01 DIAGNOSIS — K4090 Unilateral inguinal hernia, without obstruction or gangrene, not specified as recurrent: Secondary | ICD-10-CM

## 2022-01-01 NOTE — Assessment & Plan Note (Signed)
Did have repair on that side years ago Will refer back to surgery---thinks he has mesh  Will need repair again

## 2022-01-01 NOTE — Progress Notes (Signed)
Subjective:    Patient ID: Louis Anderson, male    DOB: [birth year], 71 y.o.   MRN: [id number]  HPI Here due to concern about a possible hernia  No injury Has had some mild discomfort in left groin for some time 2 days ago---noted some increased pain Does feel a lump at times  No abdominal pain  No current outpatient medications on file prior to visit.   No current facility-administered medications on file prior to visit.    No Known Allergies  Past Medical History:  Diagnosis Date   BPH (benign prostatic hypertrophy)    Colon polyp    Hard of hearing    wears hearing aids   Wears glasses     Past Surgical History:  Procedure Laterality Date   APPENDECTOMY     COLONOSCOPY  05/2019   danis   ta polyps   INGUINAL HERNIA REPAIR  12/21/10   left   POLYPECTOMY      Family History  Problem Relation Age of Onset   Diabetes Father    Diabetes Brother    Colon cancer Brother 41       dx in 2020, had surgery - no chemo/rad   Colon cancer Cousin        first cousin   Colon polyps Neg Hx    Esophageal cancer Neg Hx    Rectal cancer Neg Hx    Stomach cancer Neg Hx     Social History   Socioeconomic History   Marital status: Married    Spouse name: Not on file   Number of children: 2   Years of education: Not on file   Highest education level: Not on file  Occupational History   Occupation: [employer]    Comment: Retired  Tobacco Use   Smoking status: Former    Packs/day: 0.50    Types: Cigarettes    Start date: 12/1976    Quit date: 11/1977    Years since quitting: 44.1   Smokeless tobacco: Never  Vaping Use   Vaping Use: Never used  Substance and Sexual Activity   Alcohol use: Yes    Alcohol/week: 15.0 standard drinks    Types: 14 Glasses of wine, 1 Shots of liquor per week    Comment: 2 glasss of wine a day   Drug use: No   Sexual activity: Yes  Other Topics Concern   Not on file  Social History Narrative   Has living will    Wife is  health care POA. Alternate is daughter   Would accept resuscitation but no prolonged ventilation   No tube feeds if cognitively unaware   Social Determinants of Health   Financial Resource Strain: Not on file  Food Insecurity: Not on file  Transportation Needs: Not on file  Physical Activity: Not on file  Stress: Not on file  Social Connections: Not on file  Intimate Partner Violence: Not on file   Review of Systems Did have N/V 2 days ago--after eating [place] food (?bad shrimp) Voids okay Bowels move okay     Objective:   Physical Exam Constitutional:      Appearance: Normal appearance.  Abdominal:     Palpations: Abdomen is soft.     Tenderness: There is no abdominal tenderness.     Comments: Moderate left inguinal hernia--not appreciably into scrotum Does reduce Not really tender  Neurological:     Mental Status: He is alert.  Assessment & Plan:

## 2022-01-25 DIAGNOSIS — K4091 Unilateral inguinal hernia, without obstruction or gangrene, recurrent: Secondary | ICD-10-CM | POA: Diagnosis not present

## 2022-01-30 ENCOUNTER — Telehealth: Payer: Self-pay | Admitting: Gastroenterology

## 2022-01-30 NOTE — Telephone Encounter (Signed)
Inbound call from patient wife. Patient have a recall colonoscopy due 05/2022. States patient is having a hernia repair in April. Is asking if colonoscopy should be done before the hernia repair

## 2022-01-30 NOTE — Telephone Encounter (Signed)
Lm on vm for patient's wife to return call. 

## 2022-01-30 NOTE — Telephone Encounter (Signed)
I read the recent surgical consult note.  Louis Anderson should have his hernia repair before the colonoscopy.  -HD

## 2022-01-30 NOTE — Telephone Encounter (Signed)
Please see note below.   Thanks

## 2022-01-31 NOTE — Telephone Encounter (Signed)
Pt's wife returned call. She is aware that pt should proceed with inguinal hernia repair prior to recall colonoscopy. Pt's wife verbalized understanding and had no concerns at the end of the call.

## 2022-02-01 ENCOUNTER — Encounter: Payer: Medicare HMO | Admitting: Internal Medicine

## 2022-03-20 ENCOUNTER — Other Ambulatory Visit: Payer: Self-pay | Admitting: Internal Medicine

## 2022-03-20 DIAGNOSIS — K219 Gastro-esophageal reflux disease without esophagitis: Secondary | ICD-10-CM

## 2022-04-01 ENCOUNTER — Other Ambulatory Visit: Payer: Medicare HMO

## 2022-04-04 DIAGNOSIS — K4091 Unilateral inguinal hernia, without obstruction or gangrene, recurrent: Secondary | ICD-10-CM | POA: Diagnosis not present

## 2022-04-05 ENCOUNTER — Encounter: Payer: Medicare HMO | Admitting: Internal Medicine

## 2022-04-18 ENCOUNTER — Telehealth: Payer: Self-pay | Admitting: Gastroenterology

## 2022-04-18 NOTE — Telephone Encounter (Signed)
Lm on home vm for patient to return call.  

## 2022-04-18 NOTE — Telephone Encounter (Signed)
This patient is due for a surveillance colonoscopy. ? ?Please see February phone note regarding patient's inquiry about timing of colonoscopy relative to planned inguinal hernia repair. ? ?He does not appear to have had that hernia repaired done, at least not within the [facility] health system. ? ?Please inquire if and when he is planning to do that so we can plan for colonoscopy sometime afterward. ? ?HD ?

## 2022-04-18 NOTE — Telephone Encounter (Signed)
Pt returned call. He states that he had his surgery a couple of weeks ago with [facility]. He does not have a planned follow up with their office. He states that he is doing fine.  ?

## 2022-04-19 NOTE — Telephone Encounter (Signed)
No surgical report in Care Everywhere. I called [facility] and left a detailed message for Medical records. I requested that they fax over surgical report from pt's recent hiatal hernia surgery. I asked that they fax it to my attention, I left the office number if they had any questions.  ?

## 2022-04-22 NOTE — Telephone Encounter (Signed)
Records received - placed in your IN box for your review. Thanks ?

## 2022-04-22 NOTE — Telephone Encounter (Addendum)
Called and spoke with patient regard Dr. Loletha Carrow' recommendations. Pt has been scheduled for an in person previsit appt on Wednesday, 05/22/22 at 11 am. Pt is scheduled for a colonoscopy in the [facility] on Monday, 06/03/22 at 11:30 am. Pt is aware that he will need to arrive at 10:30 am with a care partner. Pt verbalized understanding and had no concerns at the end of the call. ? ?Letter mailed to pt with appt information. ?

## 2022-04-22 NOTE — Telephone Encounter (Signed)
Called Medical Records dept. @ [facility] to follow up on records request from last week. I spoke with Louis Anderson, she states that she was out of the office last week and has not checked her message yet. She is faxing over office consult from February and procedure from 04/04/22. Will await fax.  ?

## 2022-04-22 NOTE — Telephone Encounter (Signed)
Thank you for getting these records, and I reviewed the operative report for left inguinal hernia repair on 04/04/2022 by Dr. Kieth Brightly. ? ?This patient can be scheduled for his surveillance colonoscopy with me in the [facility] whenever he is ready. ?Indication is history of colon polyps. ? ?HD ?

## 2022-05-16 ENCOUNTER — Other Ambulatory Visit: Payer: Self-pay | Admitting: Internal Medicine

## 2022-05-16 DIAGNOSIS — K219 Gastro-esophageal reflux disease without esophagitis: Secondary | ICD-10-CM

## 2022-05-22 ENCOUNTER — Ambulatory Visit (AMBULATORY_SURGERY_CENTER): Payer: Medicare HMO | Admitting: *Deleted

## 2022-05-22 VITALS — Ht 71.5 in | Wt 201.0 lb

## 2022-05-22 DIAGNOSIS — Z86010 Personal history of colonic polyps: Secondary | ICD-10-CM

## 2022-05-22 MED ORDER — NA SULFATE-K SULFATE-MG SULF 17.5-3.13-1.6 GM/177ML PO SOLN: 1.0000 | ORAL | 0 refills | Status: DC

## 2022-05-22 NOTE — Progress Notes (Signed)
Patient is here in-person for PV. Patient denies any allergies to eggs or soy. Patient denies any problems with anesthesia/sedation. Patient is not on any oxygen at home. Patient is not taking any diet/weight loss medications or blood thinners. Patient is aware of our care-partner policy. Singlecare card given to pt to use for suprep rx.  EMMI education assigned to the patient for the procedure, sent to [place].

## 2022-05-26 ENCOUNTER — Encounter: Payer: Self-pay | Admitting: Certified Registered Nurse Anesthetist

## 2022-05-29 ENCOUNTER — Encounter: Payer: Self-pay | Admitting: Gastroenterology

## 2022-05-31 ENCOUNTER — Other Ambulatory Visit (INDEPENDENT_AMBULATORY_CARE_PROVIDER_SITE_OTHER): Payer: Medicare HMO

## 2022-05-31 DIAGNOSIS — K219 Gastro-esophageal reflux disease without esophagitis: Secondary | ICD-10-CM

## 2022-05-31 LAB — CBC WITH DIFFERENTIAL/PLATELET
Basophils Absolute: 0 10*3/uL (ref 0.0–0.1)
Basophils Relative: 0.7 % (ref 0.0–3.0)
Eosinophils Absolute: 0.2 10*3/uL (ref 0.0–0.7)
Eosinophils Relative: 4 % (ref 0.0–5.0)
HCT: 46.3 % (ref 39.0–52.0)
Hemoglobin: 15.8 g/dL (ref 13.0–17.0)
Lymphocytes Relative: 25.5 % (ref 12.0–46.0)
Lymphs Abs: 1.5 10*3/uL (ref 0.7–4.0)
MCHC: 34.1 g/dL (ref 30.0–36.0)
MCV: 92 fl (ref 78.0–100.0)
Monocytes Absolute: 0.6 10*3/uL (ref 0.1–1.0)
Monocytes Relative: 9.3 % (ref 3.0–12.0)
Neutro Abs: 3.6 10*3/uL (ref 1.4–7.7)
Neutrophils Relative %: 60.5 % (ref 43.0–77.0)
Platelets: 225 10*3/uL (ref 150.0–400.0)
RBC: 5.04 Mil/uL (ref 4.22–5.81)
RDW: 14 % (ref 11.5–15.5)
WBC: 6 10*3/uL (ref 4.0–10.5)

## 2022-05-31 LAB — COMPREHENSIVE METABOLIC PANEL
ALT: 24 U/L (ref 0–53)
AST: 18 U/L (ref 0–37)
Albumin: 4.1 g/dL (ref 3.5–5.2)
Alkaline Phosphatase: 58 U/L (ref 39–117)
BUN: 17 mg/dL (ref 6–23)
CO2: 29 mEq/L (ref 19–32)
Calcium: 9.1 mg/dL (ref 8.4–10.5)
Chloride: 104 mEq/L (ref 96–112)
Creatinine, Ser: 0.88 mg/dL (ref 0.40–1.50)
GFR: 85.36 mL/min (ref >60.00)
Glucose, Bld: 95 mg/dL (ref 70–99)
Potassium: 4.5 mEq/L (ref 3.5–5.1)
Sodium: 139 mEq/L (ref 135–145)
Total Bilirubin: 1.2 mg/dL (ref 0.2–1.2)
Total Protein: 6.4 g/dL (ref 6.0–8.3)

## 2022-05-31 LAB — LIPID PANEL
Cholesterol: 226 mg/dL — ABNORMAL HIGH (ref 0–200)
HDL: 68.2 mg/dL (ref >39.00)
LDL Cholesterol: 138 mg/dL — ABNORMAL HIGH (ref 0–99)
NonHDL: 157.79
Total CHOL/HDL Ratio: 3
Triglycerides: 99 mg/dL (ref 0.0–149.0)
VLDL: 19.8 mg/dL (ref 0.0–40.0)

## 2022-06-03 ENCOUNTER — Ambulatory Visit (AMBULATORY_SURGERY_CENTER): Payer: Medicare HMO | Admitting: Gastroenterology

## 2022-06-03 ENCOUNTER — Encounter: Payer: Self-pay | Admitting: Gastroenterology

## 2022-06-03 VITALS — BP 107/75 | HR 62 | Temp 98.2°F | Resp 13 | Ht 71.5 in | Wt 201.0 lb

## 2022-06-03 DIAGNOSIS — D128 Benign neoplasm of rectum: Secondary | ICD-10-CM

## 2022-06-03 DIAGNOSIS — D122 Benign neoplasm of ascending colon: Secondary | ICD-10-CM

## 2022-06-03 DIAGNOSIS — D120 Benign neoplasm of cecum: Secondary | ICD-10-CM

## 2022-06-03 DIAGNOSIS — Z86010 Personal history of colonic polyps: Secondary | ICD-10-CM | POA: Diagnosis not present

## 2022-06-03 DIAGNOSIS — D123 Benign neoplasm of transverse colon: Secondary | ICD-10-CM

## 2022-06-03 DIAGNOSIS — Z09 Encounter for follow-up examination after completed treatment for conditions other than malignant neoplasm: Secondary | ICD-10-CM | POA: Diagnosis not present

## 2022-06-03 MED ORDER — SODIUM CHLORIDE 0.9 % IV SOLN: 500.0000 mL | Freq: Once | INTRAVENOUS | Status: DC

## 2022-06-03 NOTE — Progress Notes (Signed)
Pt's states no medical or surgical changes since previsit or office visit. 

## 2022-06-03 NOTE — Patient Instructions (Addendum)
Impression/Recommendations:  Polyp handout given to patient.  Resume previous diet. Continue present medications. Await pathology results.  Repeat colonoscopy recommended for surveillance.  Date to be determined after pathology results reviewed.  YOU HAD AN ENDOSCOPIC PROCEDURE TODAY AT [facility]:   Refer to the procedure report that was given to you for any specific questions about what was found during the examination.  If the procedure report does not answer your questions, please call your gastroenterologist to clarify.  If you requested that your care partner not be given the details of your procedure findings, then the procedure report has been included in a sealed envelope for you to review at your convenience later.  YOU SHOULD EXPECT: Some feelings of bloating in the abdomen. Passage of more gas than usual.  Walking can help get rid of the air that was put into your GI tract during the procedure and reduce the bloating. If you had a lower endoscopy (such as a colonoscopy or flexible sigmoidoscopy) you may notice spotting of blood in your stool or on the toilet paper. If you underwent a bowel prep for your procedure, you may not have a normal bowel movement for a few days.  Please Note:  You might notice some irritation and congestion in your nose or some drainage.  This is from the oxygen used during your procedure.  There is no need for concern and it should clear up in a day or so.  SYMPTOMS TO REPORT IMMEDIATELY:  Following lower endoscopy (colonoscopy or flexible sigmoidoscopy):  Excessive amounts of blood in the stool  Significant tenderness or worsening of abdominal pains  Swelling of the abdomen that is new, acute  Fever of 100F or higher For urgent or emergent issues, a gastroenterologist can be reached at any hour by calling [phone]. Do not use MyChart messaging for urgent concerns.    DIET:  We do recommend a small meal at first, but then you  may proceed to your regular diet.  Drink plenty of fluids but you should avoid alcoholic beverages for 24 hours.  ACTIVITY:  You should plan to take it easy for the rest of today and you should NOT DRIVE or use heavy machinery until tomorrow (because of the sedation medicines used during the test).    FOLLOW UP: Our staff will call the number listed on your records 24-72 hours following your procedure to check on you and address any questions or concerns that you may have regarding the information given to you following your procedure. If we do not reach you, we will leave a message.  We will attempt to reach you two times.  During this call, we will ask if you have developed any symptoms of COVID 19. If you develop any symptoms (ie: fever, flu-like symptoms, shortness of breath, cough etc.) before then, please call [phone].  If you test positive for Covid 19 in the 2 weeks post procedure, please call and report this information to [place].    If any biopsies were taken you will be contacted by phone or by letter within the next 1-3 weeks.  Please call us at [phone] if you have not heard about the biopsies in 3 weeks.    SIGNATURES/CONFIDENTIALITY: You and/or your care partner have signed paperwork which will be entered into your electronic medical record.  These signatures attest to the fact that that the information above on your After Visit Summary has been reviewed and is understood.  Full responsibility of  the confidentiality of this discharge information lies with you and/or your care-partner.

## 2022-06-03 NOTE — Progress Notes (Signed)
Report given to PACU, vss 

## 2022-06-03 NOTE — Op Note (Addendum)
[place] Patient Name: Louis Anderson Procedure Date: 06/03/2022 11:42 AM MRN: [id number] Endoscopist: Mallie Mussel L. Danis , MD Age: 71 Referring MD:  Date of Birth: 1951/10/31 Gender: Male Account #: [id number] Procedure:                Colonoscopy Indications:              Increased risk colon cancer surveillance: Personal                            history of adenoma (10 mm or greater in size)                           No polyps 2009                           March 2019 poor prep, <27m TA and SSP                           June 2020 817mcecal SSP (better prep) and 2012m.C.                            TA removed piecemeal                           June 2021 TA & SSP x 3 (better prep - no polyp at                            tattoo site) Medicines:                Monitored Anesthesia Care Procedure:                Pre-Anesthesia Assessment:                           - Prior to the procedure, a History and Physical                            was performed, and patient medications and                            allergies were reviewed. The patient's tolerance of                            previous anesthesia was also reviewed. The risks                            and benefits of the procedure and the sedation                            options and risks were discussed with the patient.                            All questions were answered, and informed consent  was obtained. Prior Anticoagulants: The patient has                            taken no previous anticoagulant or antiplatelet                            agents. ASA Grade Assessment: I - A normal, healthy                            patient. After reviewing the risks and benefits,                            the patient was deemed in satisfactory condition to                            undergo the procedure.                           After obtaining informed consent, the colonoscope                             was passed under direct vision. Throughout the                            procedure, the patient's blood pressure, pulse, and                            oxygen saturations were monitored continuously. The                            CF [id number] #[id number] was introduced through the anus                            and advanced to the the cecum, identified by                            appendiceal orifice and ileocecal valve. The                            colonoscopy was somewhat difficult due to a                            redundant colon and significant looping. Successful                            completion of the procedure was aided by using an                            abdominal binder, manual pressure and straightening                            and shortening the scope to obtain bowel loop  reduction. The patient tolerated the procedure                            well. The quality of the bowel preparation was                            good. The ileocecal valve, appendiceal orifice, and                            rectum were photographed. The bowel preparation                            used was 2 day Suprep/Miralax. Scope In: 11:48:31 AM Scope Out: 12:12:04 PM Scope Withdrawal Time: 0 hours 17 minutes 34 seconds  Total Procedure Duration: 0 hours 23 minutes 33 seconds  Findings:                 The perianal and digital rectal examinations were                            normal.                           Repeat examination of right colon under NBI                            performed.                           The colon (entire examined portion) was redundant.                           A tattoo was seen in the ascending colon. The                            tattoo site appeared normal.                           Four sessile polyps were found in the rectum,                            transverse colon, ascending colon and cecum. The                             polyps were 2 to 5 mm in size. These polyps were                            removed with a cold snare. Resection and retrieval                            were complete.                           The exam was otherwise without abnormality on  direct and retroflexion views. Complications:            No immediate complications. Estimated Blood Loss:     Estimated blood loss was minimal. Impression:               - Redundant colon.                           - A tattoo was seen in the ascending colon. The                            tattoo site appeared normal.                           - Four 2 to 5 mm polyps in the rectum, in the                            transverse colon, in the ascending colon and in the                            cecum, removed with a cold snare. Resected and                            retrieved.                           - The examination was otherwise normal on direct                            and retroflexion views. Recommendation:           - Patient has a contact number available for                            emergencies. The signs and symptoms of potential                            delayed complications were discussed with the                            patient. Return to normal activities tomorrow.                            Written discharge instructions were provided to the                            patient.                           - Resume previous diet.                           - Continue present medications.                           - Await pathology results.                           -  Repeat colonoscopy is recommended for                            surveillance. The colonoscopy date will be                            determined after pathology results from today's                            exam become available for review. Khylee Algeo L. Loletha Carrow, MD 06/03/2022 12:20:03 PM This report has been signed electronically.

## 2022-06-03 NOTE — Progress Notes (Signed)
History and Physical:  This patient presents for endoscopic testing for: Encounter Diagnosis  Name Primary?   Personal history of colonic polyps Yes    Multiple colon polyps in 2019, 2020 and 2021. Has lately had lower abdominal pain when bending over - no change in bowel habits and no rectal bleeding. Had recent left inguinal hernia repair.  Patient is otherwise without complaints or active issues today.   Past Medical History: Past Medical History:  Diagnosis Date   BPH (benign prostatic hypertrophy)    Colon polyp    Hard of hearing    wears hearing aids   Wears glasses      Past Surgical History: Past Surgical History:  Procedure Laterality Date   APPENDECTOMY     COLONOSCOPY  05/2019   danis   ta polyps   COLONOSCOPY WITH PROPOFOL  06/21/2020   Dr.Danis   INGUINAL HERNIA REPAIR  12/21/2010   left   POLYPECTOMY      Allergies: No Known Allergies  Outpatient Meds: No current outpatient medications on file.   Current Facility-Administered Medications  Medication Dose Route Frequency Provider Last Rate Last Admin   0.9 %  sodium chloride infusion  500 mL Intravenous Once Doran Stabler, MD          ___________________________________________________________________ Objective   Exam:  BP 121/76   Pulse 70   Temp 98.2 F (36.8 C)   Ht 5' 11.5" (1.816 m)   Wt 201 lb (91.2 kg)   SpO2 97%   BMI 27.64 kg/m   CV: RRR without murmur, S1/S2 Resp: clear to auscultation bilaterally, normal RR and effort noted GI: soft, no tenderness, with active bowel sounds.   Assessment: Encounter Diagnosis  Name Primary?   Personal history of colonic polyps Yes     Plan: Colonoscopy  The benefits and risks of the planned procedure were described in detail with the patient or (when appropriate) their health care proxy.  Risks were outlined as including, but not limited to, bleeding, infection, perforation, adverse medication reaction leading to cardiac or  pulmonary decompensation, pancreatitis (if ERCP).  The limitation of incomplete mucosal visualization was also discussed.  No guarantees or warranties were given.    The patient is appropriate for an endoscopic procedure in the ambulatory setting.   - Wilfrid Lund, MD

## 2022-06-03 NOTE — Progress Notes (Signed)
Called to room to assist during endoscopic procedure.  Patient ID and intended procedure confirmed with present staff. Received instructions for my participation in the procedure from the performing physician.  

## 2022-06-04 ENCOUNTER — Telehealth: Payer: Self-pay | Admitting: *Deleted

## 2022-06-04 NOTE — Telephone Encounter (Signed)
  Follow up Call-     06/03/2022   10:43 AM 06/21/2020    1:47 PM  Call back number  Post procedure Call Back phone  # [phone]  Permission to leave phone message Yes Yes     Patient questions:  Do you have a fever, pain , or abdominal swelling? No. Pain Score  0 *  Have you tolerated food without any problems? Yes.    Have you been able to return to your normal activities? Yes.    Do you have any questions about your discharge instructions: Diet   No. Medications  No. Follow up visit  No.  Do you have questions or concerns about your Care? No.  Actions: * If pain score is 4 or above: No action needed, pain <4.

## 2022-06-06 ENCOUNTER — Encounter: Payer: Self-pay | Admitting: Internal Medicine

## 2022-06-06 ENCOUNTER — Encounter: Payer: Self-pay | Admitting: Gastroenterology

## 2022-06-06 ENCOUNTER — Ambulatory Visit (INDEPENDENT_AMBULATORY_CARE_PROVIDER_SITE_OTHER): Payer: Medicare HMO | Admitting: Internal Medicine

## 2022-06-06 VITALS — BP 122/80 | HR 71 | Temp 97.9°F | Ht 71.0 in | Wt 198.1 lb

## 2022-06-06 DIAGNOSIS — Z0000 Encounter for general adult medical examination without abnormal findings: Secondary | ICD-10-CM | POA: Diagnosis not present

## 2022-06-06 DIAGNOSIS — N138 Other obstructive and reflux uropathy: Secondary | ICD-10-CM

## 2022-06-06 DIAGNOSIS — K219 Gastro-esophageal reflux disease without esophagitis: Secondary | ICD-10-CM

## 2022-06-06 DIAGNOSIS — Z7189 Other specified counseling: Secondary | ICD-10-CM | POA: Diagnosis not present

## 2022-06-06 DIAGNOSIS — N401 Enlarged prostate with lower urinary tract symptoms: Secondary | ICD-10-CM | POA: Diagnosis not present

## 2022-06-06 NOTE — Assessment & Plan Note (Signed)
See social history 

## 2022-06-06 NOTE — Assessment & Plan Note (Signed)
Mild Just uses tums prn

## 2022-06-06 NOTE — Assessment & Plan Note (Signed)
I have personally reviewed the Medicare Annual Wellness questionnaire and have noted 1. The patient's medical and social history 2. Their use of alcohol, tobacco or illicit drugs 3. Their current medications and supplements 4. The patient's functional ability including ADL's, fall risks, home safety risks and hearing or visual             impairment. 5. Diet and physical activities 6. Evidence for depression or mood disorders  The patients weight, height, BMI and visual acuity have been recorded in the chart I have made referrals, counseling and provided education to the patient based review of the above and I have provided the pt with a written personalized care plan for preventive services.  I have provided you with a copy of your personalized plan for preventive services. Please take the time to review along with your updated medication list.  Just had colon--due again in 3 years No PSA due to age Discussed adding resistance training Td at pharmacy--consider shingrix He is reluctant to take COVID or flu vaccines---I encouraged him to get them

## 2022-06-06 NOTE — Assessment & Plan Note (Signed)
Mild symptoms If worsens, will start tamsulosin 0.'4mg'$  daily

## 2022-06-06 NOTE — Progress Notes (Signed)
Subjective:    Patient ID: Louis Anderson, male    DOB: Apr 15, 1951, 71 y.o.   MRN: [id number]  HPI Here for Medicare wellness visit and follow up of chronic health conditions Reviewed form and advanced directives Reviewed other doctors 2 glasses of wine most days No tobacco Vision is good Hearing aides help some Walks regularly --no resistance (discussed) No falls No depression or anhedonia Independent wit instrumental ADLs Mild memory issues--nothing worrisome  Did have the hernia repaired Has recovered completely from this  Occasional heartburn No meds ---except occasional tums No dysphagia  Voids okay Mildly slow stream and some frequency Nocturia x 1-3 Not bad enough for Rx  No current outpatient medications on file prior to visit.   Current Facility-Administered Medications on File Prior to Visit  Medication Dose Route Frequency Provider Last Rate Last Admin   0.9 %  sodium chloride infusion  500 mL Intravenous Once Nelida Meuse III, MD        No Known Allergies  Past Medical History:  Diagnosis Date   BPH (benign prostatic hypertrophy)    Colon polyp    Hard of hearing    wears hearing aids   Wears glasses     Past Surgical History:  Procedure Laterality Date   APPENDECTOMY     COLONOSCOPY  05/2019   danis   ta polyps   COLONOSCOPY WITH PROPOFOL  06/21/2020   Dr.Danis   INGUINAL HERNIA REPAIR  12/21/2010   left   POLYPECTOMY      Family History  Problem Relation Age of Onset   Diabetes Father    Diabetes Brother    Colon cancer Brother 61       dx in 2020, had surgery - no chemo/rad   Colon cancer Cousin        first cousin   Colon polyps Neg Hx    Esophageal cancer Neg Hx    Rectal cancer Neg Hx    Stomach cancer Neg Hx     Social History   Socioeconomic History   Marital status: Married    Spouse name: Not on file   Number of children: 2   Years of education: Not on file   Highest education level: Not on file  Occupational  History   Occupation: Associate Professor    Comment: Retired  Tobacco Use   Smoking status: Former    Packs/day: 0.50    Types: Cigarettes    Start date: 12/1976    Quit date: 11/1977    Years since quitting: 44.5   Smokeless tobacco: Never  Vaping Use   Vaping Use: Never used  Substance and Sexual Activity   Alcohol use: Yes    Alcohol/week: 15.0 standard drinks of alcohol    Types: 14 Glasses of wine, 1 Shots of liquor per week    Comment: 2 glasss of wine a day   Drug use: No   Sexual activity: Yes  Other Topics Concern   Not on file  Social History Narrative   Has living will    Wife is health care POA. Alternate is daughter   Would accept resuscitation but no prolonged ventilation   No tube feeds if cognitively unaware   Social Determinants of Health   Financial Resource Strain: Not on file  Food Insecurity: Not on file  Transportation Needs: Not on file  Physical Activity: Not on file  Stress: Not on file  Social Connections: Not on file  Intimate Partner Violence:  Not on file   Review of Systems Appetite is good Weight is stable Sleeps okay--some restless nights Wears seat belt Teeth are fine--keeps up with dentist Has small mole on left arm to be checked---no dermatologist now No chest pain or SOB No dizziness or syncope Bowels okay---no blood No sig back or joint pains    Objective:   Physical Exam Constitutional:      Appearance: Normal appearance.  HENT:     Mouth/Throat:     Comments: No lesions Eyes:     Conjunctiva/sclera: Conjunctivae normal.     Pupils: Pupils are equal, round, and reactive to light.  Cardiovascular:     Rate and Rhythm: Normal rate and regular rhythm.     Pulses: Normal pulses.     Heart sounds: No murmur heard.    No gallop.  Pulmonary:     Effort: Pulmonary effort is normal.     Breath sounds: Normal breath sounds. No wheezing or rales.  Abdominal:     Palpations: Abdomen is soft.     Tenderness: There is no  abdominal tenderness.  Musculoskeletal:     Cervical back: Neck supple.     Right lower leg: No edema.     Left lower leg: No edema.  Lymphadenopathy:     Cervical: No cervical adenopathy.  Skin:    Findings: No lesion or rash.  Neurological:     General: No focal deficit present.     Mental Status: He is alert and oriented to person, place, and time.     Comments: Mini-cog normal  Psychiatric:        Mood and Affect: Mood normal.        Behavior: Behavior normal.            Assessment & Plan:

## 2022-10-10 DIAGNOSIS — H02834 Dermatochalasis of left upper eyelid: Secondary | ICD-10-CM | POA: Diagnosis not present

## 2022-10-10 DIAGNOSIS — H5213 Myopia, bilateral: Secondary | ICD-10-CM | POA: Diagnosis not present

## 2022-10-10 DIAGNOSIS — H52203 Unspecified astigmatism, bilateral: Secondary | ICD-10-CM | POA: Diagnosis not present

## 2022-10-10 DIAGNOSIS — H2513 Age-related nuclear cataract, bilateral: Secondary | ICD-10-CM | POA: Diagnosis not present

## 2022-10-10 DIAGNOSIS — H02831 Dermatochalasis of right upper eyelid: Secondary | ICD-10-CM | POA: Diagnosis not present

## 2022-12-04 DIAGNOSIS — D492 Neoplasm of unspecified behavior of bone, soft tissue, and skin: Secondary | ICD-10-CM | POA: Diagnosis not present

## 2022-12-04 DIAGNOSIS — L821 Other seborrheic keratosis: Secondary | ICD-10-CM | POA: Diagnosis not present

## 2023-06-09 ENCOUNTER — Ambulatory Visit (INDEPENDENT_AMBULATORY_CARE_PROVIDER_SITE_OTHER): Payer: Medicare HMO | Admitting: Internal Medicine

## 2023-06-09 ENCOUNTER — Encounter: Payer: Self-pay | Admitting: Internal Medicine

## 2023-06-09 ENCOUNTER — Other Ambulatory Visit: Payer: Self-pay | Admitting: Internal Medicine

## 2023-06-09 VITALS — BP 104/70 | HR 86 | Temp 97.1°F | Ht 71.0 in | Wt 201.0 lb

## 2023-06-09 DIAGNOSIS — K219 Gastro-esophageal reflux disease without esophagitis: Secondary | ICD-10-CM

## 2023-06-09 DIAGNOSIS — N138 Other obstructive and reflux uropathy: Secondary | ICD-10-CM | POA: Diagnosis not present

## 2023-06-09 DIAGNOSIS — E785 Hyperlipidemia, unspecified: Secondary | ICD-10-CM

## 2023-06-09 DIAGNOSIS — N401 Enlarged prostate with lower urinary tract symptoms: Secondary | ICD-10-CM | POA: Diagnosis not present

## 2023-06-09 DIAGNOSIS — Z7189 Other specified counseling: Secondary | ICD-10-CM | POA: Diagnosis not present

## 2023-06-09 DIAGNOSIS — E7800 Pure hypercholesterolemia, unspecified: Secondary | ICD-10-CM

## 2023-06-09 DIAGNOSIS — Z0000 Encounter for general adult medical examination without abnormal findings: Secondary | ICD-10-CM | POA: Diagnosis not present

## 2023-06-09 NOTE — Assessment & Plan Note (Signed)
Mild but fairly low risk with good HDL He doesn't want to take medication for this

## 2023-06-09 NOTE — Assessment & Plan Note (Signed)
Does okay with just prn rolaids No dysphagia Discussed OTC pepcid or prilosec if worsens

## 2023-06-09 NOTE — Assessment & Plan Note (Signed)
See social history 

## 2023-06-09 NOTE — Assessment & Plan Note (Signed)
Ongoing mild symptoms If worsens, would try tamsulosin

## 2023-06-09 NOTE — Assessment & Plan Note (Signed)
I have personally reviewed the Medicare Annual Wellness questionnaire and have noted 1. The patient's medical and social history 2. Their use of alcohol, tobacco or illicit drugs 3. Their current medications and supplements 4. The patient's functional ability including ADL's, fall risks, home safety risks and hearing or visual             impairment. 5. Diet and physical activities 6. Evidence for depression or mood disorders  The patients weight, height, BMI and visual acuity have been recorded in the chart I have made referrals, counseling and provided education to the patient based review of the above and I have provided the pt with a written personalized care plan for preventive services.  I have provided you with a copy of your personalized plan for preventive services. Please take the time to review along with your updated medication list.  Colon due again in 2 years No PSA due to age Discussed adding resistance training to his walking Td at pharmacy Recommended flu/COVID/RSV vaccines in fall (he is not excited about this) Consider shingrix at pharmacy

## 2023-06-09 NOTE — Progress Notes (Signed)
Subjective:    Patient ID: Louis Anderson, male    DOB: 01/02/1951, 72 y.o.   MRN: [id number]  HPI Here for Medicare wellness visit and follow up of chronic health conditions Reviewed advanced directives Reviewed other doctors---Dr Gray--derm, Dr Lynnell Dike, Dr Ty Hilts, [facility] No hospitalizations or surgery in the past year Does walks regularly--discussed resistance Still enjoys wine with dinner No tobacco Vision is fine Hearing aides--new ones--they help No falls No depression or anhedonia--rues getting older at times Independent with instrumental ADLs No sig memory issues  Finds his urine looks dark--even with drinking enough No dysuria Some slow stream and dribbling Not enough for medication Some urgency at times  Some heartburn---better though Coffee brings it on--he has cut back No dysphagia Rarely uses rolaids with relief  Cholesterol was mildly elevated  No current outpatient medications on file prior to visit.   No current facility-administered medications on file prior to visit.    No Known Allergies  Past Medical History:  Diagnosis Date   BPH (benign prostatic hypertrophy)    Colon polyp    Hard of hearing    wears hearing aids   Wears glasses     Past Surgical History:  Procedure Laterality Date   APPENDECTOMY     COLONOSCOPY  05/2019   danis   ta polyps   COLONOSCOPY WITH PROPOFOL  06/21/2020   Dr.Danis   INGUINAL HERNIA REPAIR  12/21/2010   left   POLYPECTOMY      Family History  Problem Relation Age of Onset   Diabetes Father    Diabetes Brother    Colon cancer Brother 38       dx in 2020, had surgery - no chemo/rad   Colon cancer Cousin        first cousin   Colon polyps Neg Hx    Esophageal cancer Neg Hx    Rectal cancer Neg Hx    Stomach cancer Neg Hx     Social History   Socioeconomic History   Marital status: Married    Spouse name: Not on file   Number of children: 2   Years of education: Not on  file   Highest education level: Not on file  Occupational History   Occupation: [employer] as a second language teacher    Comment: Retired  Tobacco Use   Smoking status: Former    Packs/day: .5    Types: Cigarettes    Start date: 12/1976    Quit date: 11/1977    Years since quitting: 45.5    Passive exposure: Never   Smokeless tobacco: Never  Vaping Use   Vaping Use: Never used  Substance and Sexual Activity   Alcohol use: Yes    Alcohol/week: 15.0 standard drinks of alcohol    Types: 14 Glasses of wine, 1 Shots of liquor per week    Comment: 2 glasss of wine a day   Drug use: No   Sexual activity: Yes  Other Topics Concern   Not on file  Social History Narrative   Has living will    Wife is health care POA. Alternate is daughter   Would accept resuscitation but no prolonged ventilation   No tube feeds if cognitively unaware   Social Determinants of Health   Financial Resource Strain: Not on file  Food Insecurity: Not on file  Transportation Needs: Not on file  Physical Activity: Not on file  Stress: Not on file  Social Connections: Not on file  Intimate Partner Violence: Not  on file   Review of Systems Appetite is fine Weight fairly stable Sleeps okay--not as good as in the past Wears seat belt Teeth are fine--keeps up with dentist Did have skin excision--not cancer. Nothing suspicious today Bowels move fine--no blood No chest pain or SOB One episode of dizziness coming out of hot tub. Dizzy with head back at times. No syncope No edema No sig back or joint pain---typical aches    Objective:   Physical Exam Constitutional:      Appearance: Normal appearance.  HENT:     Mouth/Throat:     Pharynx: No oropharyngeal exudate or posterior oropharyngeal erythema.  Eyes:     Conjunctiva/sclera: Conjunctivae normal.     Pupils: Pupils are equal, round, and reactive to light.  Cardiovascular:     Rate and Rhythm: Normal rate and regular rhythm.     Pulses: Normal pulses.     Heart  sounds: No murmur heard.    No gallop.  Pulmonary:     Effort: Pulmonary effort is normal.     Breath sounds: Normal breath sounds. No wheezing or rales.  Abdominal:     Palpations: Abdomen is soft.     Tenderness: There is no abdominal tenderness.  Musculoskeletal:     Cervical back: Neck supple.     Right lower leg: No edema.     Left lower leg: No edema.  Lymphadenopathy:     Cervical: No cervical adenopathy.  Skin:    Findings: No lesion or rash.  Neurological:     General: No focal deficit present.     Mental Status: He is alert and oriented to person, place, and time.     Comments: Word naming --5 quickly then stumped (did better with [place]) Recall 3/3  Psychiatric:        Mood and Affect: Mood normal.        Behavior: Behavior normal.            Assessment & Plan:

## 2023-06-09 NOTE — Progress Notes (Signed)
Hearing Screening - Comments:: Has hearing aids. Wearing today. Vision Screening - Comments:: October 2023

## 2023-06-10 ENCOUNTER — Other Ambulatory Visit: Payer: Medicare HMO

## 2023-06-10 LAB — COMPREHENSIVE METABOLIC PANEL
ALT: 15 U/L (ref 0–53)
AST: 18 U/L (ref 0–37)
Albumin: 4.3 g/dL (ref 3.5–5.2)
Alkaline Phosphatase: 46 U/L (ref 39–117)
BUN: 17 mg/dL (ref 6–23)
CO2: 29 mEq/L (ref 19–32)
Calcium: 8.8 mg/dL (ref 8.4–10.5)
Chloride: 103 mEq/L (ref 96–112)
Creatinine, Ser: 1 mg/dL (ref 0.40–1.50)
GFR: 74.18 mL/min (ref >60.00)
Glucose, Bld: 102 mg/dL — ABNORMAL HIGH (ref 70–99)
Potassium: 4.5 mEq/L (ref 3.5–5.1)
Sodium: 139 mEq/L (ref 135–145)
Total Bilirubin: 0.7 mg/dL (ref 0.2–1.2)
Total Protein: 6.9 g/dL (ref 6.0–8.3)

## 2023-06-10 LAB — LIPID PANEL
Cholesterol: 232 mg/dL — ABNORMAL HIGH (ref 0–200)
HDL: 57.6 mg/dL (ref >39.00)
LDL Cholesterol: 154 mg/dL — ABNORMAL HIGH (ref 0–99)
NonHDL: 174.14
Total CHOL/HDL Ratio: 4
Triglycerides: 100 mg/dL (ref 0.0–149.0)
VLDL: 20 mg/dL (ref 0.0–40.0)

## 2023-06-10 LAB — CBC
HCT: 46.3 % (ref 39.0–52.0)
Hemoglobin: 15.4 g/dL (ref 13.0–17.0)
MCHC: 33.3 g/dL (ref 30.0–36.0)
MCV: 92.6 fl (ref 78.0–100.0)
Platelets: 260 10*3/uL (ref 150.0–400.0)
RBC: 5 Mil/uL (ref 4.22–5.81)
RDW: 14.3 % (ref 11.5–15.5)
WBC: 8.1 10*3/uL (ref 4.0–10.5)

## 2023-10-24 ENCOUNTER — Ambulatory Visit (INDEPENDENT_AMBULATORY_CARE_PROVIDER_SITE_OTHER): Payer: Medicare HMO | Admitting: Family Medicine

## 2023-10-24 ENCOUNTER — Encounter: Payer: Self-pay | Admitting: Family Medicine

## 2023-10-24 ENCOUNTER — Ambulatory Visit (INDEPENDENT_AMBULATORY_CARE_PROVIDER_SITE_OTHER)
Admission: RE | Admit: 2023-10-24 | Discharge: 2023-10-24 | Disposition: A | Discharge status: Home / Self Care | Payer: Medicare HMO | Source: Ambulatory Visit | Attending: Family Medicine | Admitting: Family Medicine

## 2023-10-24 VITALS — BP 120/74 | HR 63 | Temp 97.7°F | Ht 71.0 in | Wt 205.2 lb

## 2023-10-24 DIAGNOSIS — M47816 Spondylosis without myelopathy or radiculopathy, lumbar region: Secondary | ICD-10-CM | POA: Diagnosis not present

## 2023-10-24 DIAGNOSIS — M545 Low back pain, unspecified: Secondary | ICD-10-CM | POA: Insufficient documentation

## 2023-10-24 DIAGNOSIS — M5126 Other intervertebral disc displacement, lumbar region: Secondary | ICD-10-CM | POA: Diagnosis not present

## 2023-10-24 NOTE — Assessment & Plan Note (Signed)
Acute, most likely secondary to muscle spasm versus osteoarthritis of the spine.  No signs and symptoms of sciatica/herniated disc. Given age and focal tenderness to palpation over L3-L4 we will evaluate with plain x-ray to rule out compression fracture. Treat with ibuprofen 600 to 800 mg 3 times daily as needed for pain and inflammation.  Start home physical therapy for low back pain as well as heat.  Return and ER precautions provided.  Patient will follow-up if not improving as expected over the next 2 to 4 weeks.

## 2023-10-24 NOTE — Progress Notes (Signed)
Patient ID: Louis Anderson, male    DOB: 1950/12/27, 72 y.o.   MRN: [id number]  This visit was conducted in person.  BP 120/74 (BP Location: Right Arm, Patient Position: Sitting, Cuff Size: Large)   Pulse 63   Temp 97.7 F (36.5 C) (Temporal)   Ht 5\' 11"  (1.803 m)   Wt 205 lb 4 oz (93.1 kg)   SpO2 99%   BMI 28.63 kg/m    CC:  Chief Complaint  Patient presents with   Back Pain    Low x 1 month    Subjective:   HPI: Louis Anderson is a 72 y.o. male patient of Dr. Alphonsus Sias presenting on 10/24/2023 for Back Pain (Low x 1 month)  1 month ago, lifted something  cumbersome.. since then low back pain. Pain bilateral but more to left.  No radiation of pain into legs. No numbness, no weakness.  No fever, no incontinence. No fall.  More pain with leaning forward, better with leaning backwards.     No past back surgery or injections. Did have sciatica when young.. treated with chiropractor.   Has not treated with any OTC med.      Relevant past medical, surgical, family and social history reviewed and updated as indicated. Interim medical history since our last visit reviewed. Allergies and medications reviewed and updated. No outpatient medications prior to visit.   No facility-administered medications prior to visit.     Per HPI unless specifically indicated in ROS section below Review of Systems  Constitutional:  Negative for fatigue and fever.  HENT:  Negative for ear pain.   Eyes:  Negative for pain.  Respiratory:  Negative for cough and shortness of breath.   Cardiovascular:  Negative for chest pain, palpitations and leg swelling.  Gastrointestinal:  Negative for abdominal pain.  Genitourinary:  Negative for dysuria.  Musculoskeletal:  Negative for arthralgias.  Neurological:  Negative for syncope, light-headedness and headaches.  Psychiatric/Behavioral:  Negative for dysphoric mood.    Objective:  BP 120/74 (BP Location: Right Arm, Patient Position: Sitting,  Cuff Size: Large)   Pulse 63   Temp 97.7 F (36.5 C) (Temporal)   Ht 5\' 11"  (1.803 m)   Wt 205 lb 4 oz (93.1 kg)   SpO2 99%   BMI 28.63 kg/m   Wt Readings from Last 3 Encounters:  10/24/23 205 lb 4 oz (93.1 kg)  06/09/23 201 lb (91.2 kg)  06/06/22 198 lb 2 oz (89.9 kg)      Physical Exam Constitutional:      Appearance: He is well-developed.  HENT:     Head: Normocephalic.     Right Ear: Hearing normal.     Left Ear: Hearing normal.     Nose: Nose normal.  Neck:     Thyroid: No thyroid mass or thyromegaly.     Vascular: No carotid bruit.     Trachea: Trachea normal.  Cardiovascular:     Rate and Rhythm: Normal rate and regular rhythm.     Pulses: Normal pulses.     Heart sounds: Heart sounds not distant. No murmur heard.    No friction rub. No gallop.     Comments: No peripheral edema Pulmonary:     Effort: Pulmonary effort is normal. No respiratory distress.     Breath sounds: Normal breath sounds.  Musculoskeletal:     Lumbar back: Tenderness and bony tenderness present. Decreased range of motion. Negative right straight leg raise test and negative left  straight leg raise test.  Skin:    General: Skin is warm and dry.     Findings: No rash.  Psychiatric:        Speech: Speech normal.        Behavior: Behavior normal.        Thought Content: Thought content normal.       Results for orders placed or performed in visit on 06/09/23  CBC  Result Value Ref Range   WBC 8.1 4.0 - 10.5 K/uL   RBC 5.00 4.22 - 5.81 Mil/uL   Platelets 260.0 150.0 - 400.0 K/uL   Hemoglobin 15.4 13.0 - 17.0 g/dL   HCT 82.9 56.2 - 13.0 %   MCV 92.6 78.0 - 100.0 fl   MCHC 33.3 30.0 - 36.0 g/dL   RDW 86.5 78.4 - 69.6 %  Lipid panel  Result Value Ref Range   Cholesterol 232 (H) 0 - 200 mg/dL   Triglycerides 295.2 0.0 - 149.0 mg/dL   HDL 84.13 >24.40 mg/dL   VLDL 10.2 0.0 - 72.5 mg/dL   LDL Cholesterol 366 (H) 0 - 99 mg/dL   Total CHOL/HDL Ratio 4    NonHDL 174.14   Comprehensive  metabolic panel  Result Value Ref Range   Sodium 139 135 - 145 mEq/L   Potassium 4.5 3.5 - 5.1 mEq/L   Chloride 103 96 - 112 mEq/L   CO2 29 19 - 32 mEq/L   Glucose, Bld 102 (H) 70 - 99 mg/dL   BUN 17 6 - 23 mg/dL   Creatinine, Ser 4.40 0.40 - 1.50 mg/dL   Total Bilirubin 0.7 0.2 - 1.2 mg/dL   Alkaline Phosphatase 46 39 - 117 U/L   AST 18 0 - 37 U/L   ALT 15 0 - 53 U/L   Total Protein 6.9 6.0 - 8.3 g/dL   Albumin 4.3 3.5 - 5.2 g/dL   GFR 34.74 >25.95 mL/min   Calcium 8.8 8.4 - 10.5 mg/dL    Assessment and Plan  Acute left-sided low back pain without sciatica Assessment & Plan: Acute, most likely secondary to muscle spasm versus osteoarthritis of the spine.  No signs and symptoms of sciatica/herniated disc. Given age and focal tenderness to palpation over L3-L4 we will evaluate with plain x-ray to rule out compression fracture. Treat with ibuprofen 600 to 800 mg 3 times daily as needed for pain and inflammation.  Start home physical therapy for low back pain as well as heat.  Return and ER precautions provided.  Patient will follow-up if not improving as expected over the next 2 to 4 weeks.  Orders: -     DG Lumbar Spine Complete; Future    No follow-ups on file.   Louis Nora, MD

## 2023-10-24 NOTE — Patient Instructions (Signed)
We will call with X-ray result.  Start home PT , heat and can use ibuprofen  Advil 600-800 mg three times daily as needed.

## 2023-11-11 ENCOUNTER — Ambulatory Visit (INDEPENDENT_AMBULATORY_CARE_PROVIDER_SITE_OTHER): Payer: Medicare HMO

## 2023-11-11 DIAGNOSIS — Z23 Encounter for immunization: Secondary | ICD-10-CM | POA: Diagnosis not present

## 2024-06-09 ENCOUNTER — Encounter: Payer: Medicare HMO | Admitting: Internal Medicine

## 2024-11-08 ENCOUNTER — Telehealth: Payer: Self-pay | Admitting: Internal Medicine

## 2024-11-08 NOTE — Telephone Encounter (Signed)
 Copied from CRM #[id number]. Topic: Appointments - Scheduling Inquiry for Clinic >> Nov 08, 2024 10:11 AM Louis Anderson wrote: Reason for CRM: Patient called in wanting to schedule a flu shot , unable to schedule flu shot with patient, patient wife is scheduled for 11/18 at 2:30 he wanted to be schededule around the same time please call patient back to be scheduled for flu shot     Pt wants to transfer to [place] his wife is [employer]

## 2024-11-16 ENCOUNTER — Ambulatory Visit (INDEPENDENT_AMBULATORY_CARE_PROVIDER_SITE_OTHER): Admitting: *Deleted

## 2024-11-16 DIAGNOSIS — Z23 Encounter for immunization: Secondary | ICD-10-CM

## 2024-11-16 NOTE — Progress Notes (Signed)
 Per orders of Greig Ring, MD injection of Influenza Vaccine given by Manuelita JAYSON Frost in left deltoid. Patient tolerated injection well.

## 2024-11-30 DIAGNOSIS — H5213 Myopia, bilateral: Secondary | ICD-10-CM | POA: Diagnosis not present

## 2024-11-30 DIAGNOSIS — H2513 Age-related nuclear cataract, bilateral: Secondary | ICD-10-CM | POA: Diagnosis not present

## 2024-11-30 DIAGNOSIS — H02831 Dermatochalasis of right upper eyelid: Secondary | ICD-10-CM | POA: Diagnosis not present

## 2024-11-30 DIAGNOSIS — H02834 Dermatochalasis of left upper eyelid: Secondary | ICD-10-CM | POA: Diagnosis not present

## 2024-12-31 ENCOUNTER — Ambulatory Visit (INDEPENDENT_AMBULATORY_CARE_PROVIDER_SITE_OTHER)

## 2024-12-31 VITALS — BP 118/68 | HR 72 | Ht 70.0 in | Wt 204.8 lb

## 2024-12-31 DIAGNOSIS — Z0000 Encounter for general adult medical examination without abnormal findings: Secondary | ICD-10-CM | POA: Diagnosis not present

## 2024-12-31 DIAGNOSIS — Z1159 Encounter for screening for other viral diseases: Secondary | ICD-10-CM

## 2024-12-31 NOTE — Patient Instructions (Addendum)
 Mr. [place],  Thank you for taking the time for your Medicare Wellness Visit. I appreciate your continued commitment to your health goals. Please review the care plan we discussed, and feel free to reach out if I can assist you further.  Please note that Annual Wellness Visits do not include a physical exam. Some assessments may be limited, especially if the visit was conducted virtually. If needed, we may recommend an in-person follow-up with your provider.  Ongoing Care Seeing your primary care provider every 3 to 6 months helps us  monitor your health and provide consistent, personalized care.   Referrals If a referral was made during today's visit and you haven't received any updates within two weeks, please contact the referred provider directly to check on the status.  Recommended Screenings:  Health Maintenance  Topic Date Due   Hepatitis C Screening  Never done   Zoster (Shingles) Vaccine (1 of 2) 12/29/1998   DTaP/Tdap/Td vaccine (3 - Td or Tdap) 10/02/2021   COVID-19 Vaccine (1 - 2025-26 season) 08/23/2024   Colon Cancer Screening  06/03/2025   Medicare Annual Wellness Visit  12/31/2025   Pneumococcal Vaccine for age over 78  Completed   Flu Shot  Completed   Meningitis B Vaccine  Aged Out       12/31/2024    8:41 AM  Advanced Directives  Does Patient Have a Medical Advance Directive? Yes  Type of Estate Agent of [place];Living will  Does patient want to make changes to medical advance directive? Yes (Inpatient - patient requests chaplain consult to change a medical advance directive)  Copy of Healthcare Power of Attorney in Chart? No - copy requested    Vision: Annual vision screenings are recommended for early detection of glaucoma, cataracts, and diabetic retinopathy. These exams can also reveal signs of chronic conditions such as diabetes and high blood pressure.  Dental: Annual dental screenings help detect early signs of oral cancer, gum  disease, and other conditions linked to overall health, including heart disease and diabetes.

## 2024-12-31 NOTE — Progress Notes (Signed)
 "  Chief Complaint  Patient presents with   Medicare Wellness     Subjective:   Louis Anderson is a 74 y.o. male who presents for a Medicare Annual Wellness Visit.  Visit info / Clinical Intake: Medicare Wellness Visit Type:: Subsequent Annual Wellness Visit Persons participating in visit and providing information:: patient Medicare Wellness Visit Mode:: In-person (required for WTM) Interpreter Needed?: No Pre-visit prep was completed: yes AWV questionnaire completed by patient prior to visit?: yes Date:: 12/30/24 Living arrangements:: lives with spouse/significant other Patient's Overall Health Status Rating: good Typical amount of pain: some Does pain affect daily life?: no Are you currently prescribed opioids?: no  Dietary Habits and Nutritional Risks How many meals a day?: 3 Eats fruit and vegetables daily?: yes Most meals are obtained by: preparing own meals In the last 2 weeks, have you had any of the following?: none Diabetic:: no  Functional Status Activities of Daily Living (to include ambulation/medication): Independent Ambulation: Independent with device- listed below Home Assistive Devices/Equipment: Eyeglasses Medication Administration: Independent Home Management (perform basic housework or laundry): Independent Manage your own finances?: yes Primary transportation is: driving Concerns about vision?: no *vision screening is required for WTM* Concerns about hearing?: no  Fall Screening Falls in the past year?: 0 Number of falls in past year: 0 Was there an injury with Fall?: 0 Fall Risk Category Calculator: 0 Patient Fall Risk Level: Low Fall Risk  Fall Risk Patient at Risk for Falls Due to: No Fall Risks Fall risk Follow up: Falls evaluation completed; Falls prevention discussed  Home and Transportation Safety: All rugs have non-skid backing?: (!) no All stairs or steps have railings?: yes Grab bars in the bathtub or shower?: yes Have non-skid  surface in bathtub or shower?: (!) no Good home lighting?: yes Regular seat belt use?: yes Hospital stays in the last year:: no  Cognitive Assessment Difficulty concentrating, remembering, or making decisions? : no Will 6CIT or Mini Cog be Completed: yes What year is it?: 0 points What month is it?: 0 points Give patient an address phrase to remember (5 components): [address] [place], [place] About what time is it?: 0 points Count backwards from 20 to 1: 0 points Say the months of the year in reverse: 0 points Repeat the address phrase from earlier: 0 points 6 CIT Score: 0 points  Advance Directives (For Healthcare) Does Patient Have a Medical Advance Directive?: Yes Does patient want to make changes to medical advance directive?: Yes (Inpatient - patient requests chaplain consult to change a medical advance directive) Type of Advance Directive: Healthcare Power of [place]; Living will Copy of Healthcare Power of Attorney in Chart?: No - copy requested Copy of Living Will in Chart?: No - copy requested  Reviewed/Updated  Reviewed/Updated: Reviewed All (Medical, Surgical, Family, Medications, Allergies, Care Teams, Patient Goals)    Allergies (verified) Patient has no known allergies.   Current Medications (verified) No outpatient encounter medications on file as of 12/31/2024.   No facility-administered encounter medications on file as of 12/31/2024.    History: Past Medical History:  Diagnosis Date   BPH (benign prostatic hypertrophy)    Colon polyp    Hard of hearing    wears hearing aids   Wears glasses    Past Surgical History:  Procedure Laterality Date   APPENDECTOMY     COLONOSCOPY  05/2019   danis   ta polyps   COLONOSCOPY WITH PROPOFOL   06/21/2020   Dr.Danis   INGUINAL HERNIA REPAIR  12/21/2010   left   POLYPECTOMY     Family History  Problem Relation Age of Onset   Diabetes Father    Diabetes Brother    Colon cancer Brother 25       dx in 2020,  had surgery - no chemo/rad   Colon cancer Cousin        first cousin   Colon polyps Neg Hx    Esophageal cancer Neg Hx    Rectal cancer Neg Hx    Stomach cancer Neg Hx    Social History   Occupational History   Occupation: Tax Inspector    Comment: Retired  Tobacco Use   Smoking status: Former    Current packs/day: 0.00    Average packs/day: 0.5 packs/day for 0.9 years (0.5 ttl pk-yrs)    Types: Cigarettes    Start date: 12/1976    Quit date: 11/1977    Years since quitting: 47.1    Passive exposure: Never   Smokeless tobacco: Never  Vaping Use   Vaping status: Never Used  Substance and Sexual Activity   Alcohol use: Yes    Alcohol/week: 15.0 standard drinks of alcohol    Types: 14 Glasses of wine, 1 Shots of liquor per week    Comment: 2 glasss of wine a day   Drug use: No   Sexual activity: Yes   Tobacco Counseling Counseling given: Yes  SDOH Screenings   Food Insecurity: No Food Insecurity (12/31/2024)  Housing: Unknown (12/31/2024)  Transportation Needs: No Transportation Needs (12/31/2024)  Utilities: Not At Risk (12/31/2024)  Alcohol Screen: Low Risk (12/30/2024)  Depression (PHQ2-9): Low Risk (12/31/2024)  Financial Resource Strain: Low Risk (12/30/2024)  Physical Activity: Sufficiently Active (12/31/2024)  Social Connections: Socially Isolated (12/31/2024)  Stress: No Stress Concern Present (12/31/2024)  Tobacco Use: Medium Risk (12/31/2024)  Health Literacy: Adequate Health Literacy (12/31/2024)   See flowsheets for full screening details  Depression Screen PHQ 2 & 9 Depression Scale- Over the past 2 weeks, how often have you been bothered by any of the following problems? Little interest or pleasure in doing things: 0 Feeling down, depressed, or hopeless (PHQ Adolescent also includes...irritable): 0 PHQ-2 Total Score: 0 Trouble falling or staying asleep, or sleeping too much: 0 Feeling tired or having little energy: 0 Poor appetite or overeating (PHQ Adolescent also  includes...weight loss): 0 Feeling bad about yourself - or that you are a failure or have let yourself or your family down: 0 Trouble concentrating on things, such as reading the newspaper or watching television (PHQ Adolescent also includes...like school work): 0 Moving or speaking so slowly that other people could have noticed. Or the opposite - being so fidgety or restless that you have been moving around a lot more than usual: 0 Thoughts that you would be better off dead, or of hurting yourself in some way: 0 PHQ-9 Total Score: 0 If you checked off any problems, how difficult have these problems made it for you to do your work, take care of things at home, or get along with other people?: Not difficult at all  Depression Treatment Depression Interventions/Treatment : EYV7-0 Score <4 Follow-up Not Indicated     Goals Addressed               This Visit's Progress     Patient Stated (pt-stated)        Patient stated he plans to continue being active  Objective:    Today's Vitals   12/31/24 0840  BP: 118/68  Pulse: 72  SpO2: 98%  Weight: 204 lb 12.8 oz (92.9 kg)  Height: 5' 10 (1.778 m)   Body mass index is 29.39 kg/m.  Hearing/Vision screen Hearing Screening - Comments:: Wears hearing aids Vision Screening - Comments:: Wears rx glasses - up to date with routine eye exams with Selinda Reusing Immunizations and Health Maintenance Health Maintenance  Topic Date Due   Hepatitis C Screening  Never done   Zoster Vaccines- Shingrix (1 of 2) 12/29/1998   DTaP/Tdap/Td (3 - Td or Tdap) 10/02/2021   COVID-19 Vaccine (1 - 2025-26 season) 08/23/2024   Colonoscopy  06/03/2025   Medicare Annual Wellness (AWV)  12/31/2025   Pneumococcal Vaccine: 50+ Years  Completed   Influenza Vaccine  Completed   Meningococcal B Vaccine  Aged Out        Assessment/Plan:  This is a routine wellness examination for [place].  Hepatitis C Screening: Ordered today  Patient Care  Team: Jimmy Charlie FERNS, MD as PCP - General Reusing Selinda, OD as Referring Physician (Optometry) Avelina Greig BRAVO, MD as Consulting Physician [facility] Medicine)  I have personally reviewed and noted the following in the patients chart:   Medical and social history Use of alcohol, tobacco or illicit drugs  Current medications and supplements including opioid prescriptions. Functional ability and status Nutritional status Physical activity Advanced directives List of other physicians Hospitalizations, surgeries, and ER visits in previous 12 months Vitals Screenings to include cognitive, depression, and falls Referrals and appointments  Orders Placed This Encounter  Procedures   Hepatitis C antibody    Standing Status:   Future    Expiration Date:   12/31/2025   In addition, I have reviewed and discussed with patient certain preventive protocols, quality metrics, and best practice recommendations. A written personalized care plan for preventive services as well as general preventive health recommendations were provided to patient.   Louis Anderson, CMA   12/31/2024   Return in 1 year (on 12/31/2025).  After Visit Summary: (In Person-Declined) Patient declined AVS at this time.  Nurse Notes: scheduled 2027 AWV appt. "

## 2025-01-26 ENCOUNTER — Ambulatory Visit: Admitting: Family Medicine

## 2025-01-26 ENCOUNTER — Encounter: Payer: Self-pay | Admitting: Family Medicine

## 2025-01-26 VITALS — BP 110/82 | HR 68 | Temp 97.8°F | Ht 70.5 in | Wt 205.5 lb

## 2025-01-26 DIAGNOSIS — Z0000 Encounter for general adult medical examination without abnormal findings: Secondary | ICD-10-CM

## 2025-01-26 DIAGNOSIS — Z13220 Encounter for screening for lipoid disorders: Secondary | ICD-10-CM

## 2025-01-26 DIAGNOSIS — K4090 Unilateral inguinal hernia, without obstruction or gangrene, not specified as recurrent: Secondary | ICD-10-CM

## 2025-01-26 DIAGNOSIS — Z1159 Encounter for screening for other viral diseases: Secondary | ICD-10-CM

## 2025-01-26 DIAGNOSIS — E785 Hyperlipidemia, unspecified: Secondary | ICD-10-CM

## 2025-01-26 DIAGNOSIS — N138 Other obstructive and reflux uropathy: Secondary | ICD-10-CM

## 2025-01-26 LAB — LIPID PANEL
Cholesterol: 224 mg/dL — ABNORMAL HIGH (ref 28–200)
HDL: 69 mg/dL
LDL Cholesterol: 141 mg/dL — ABNORMAL HIGH (ref 10–99)
NonHDL: 155.06
Total CHOL/HDL Ratio: 3
Triglycerides: 72 mg/dL (ref 10.0–149.0)
VLDL: 14.4 mg/dL (ref 0.0–40.0)

## 2025-01-26 LAB — COMPREHENSIVE METABOLIC PANEL WITH GFR
ALT: 19 U/L (ref 3–53)
AST: 19 U/L (ref 5–37)
Albumin: 4.4 g/dL (ref 3.5–5.2)
Alkaline Phosphatase: 48 U/L (ref 39–117)
BUN: 16 mg/dL (ref 6–23)
CO2: 28 meq/L (ref 19–32)
Calcium: 9 mg/dL (ref 8.4–10.5)
Chloride: 103 meq/L (ref 96–112)
Creatinine, Ser: 0.91 mg/dL (ref 0.40–1.50)
GFR: 82.12 mL/min
Glucose, Bld: 96 mg/dL (ref 70–99)
Potassium: 5 meq/L (ref 3.5–5.1)
Sodium: 139 meq/L (ref 135–145)
Total Bilirubin: 1 mg/dL (ref 0.2–1.2)
Total Protein: 6.7 g/dL (ref 6.0–8.3)

## 2025-01-26 LAB — HEMOGLOBIN A1C: Hgb A1c MFr Bld: 5.2 % (ref 4.6–6.5)

## 2025-01-26 NOTE — Assessment & Plan Note (Signed)
 S/P repair twice.

## 2025-01-26 NOTE — Progress Notes (Signed)
 "   Patient ID: Louis Anderson, male    DOB: 12-12-1951, 74 y.o.   MRN: [id number]  This visit was conducted in person.  BP 110/82   Pulse 68   Temp 97.8 F (36.6 C) (Oral)   Ht 5' 10.5 (1.791 m)   Wt 205 lb 8 oz (93.2 kg)   SpO2 97%   BMI 29.07 kg/m    CC:  Chief Complaint  Patient presents with   Transitions Of Care    Pt states he had the flu shot 3 mths ago and states his arm is still sore.    Subjective:   HPI: Louis Anderson is a 74 y.o. male presenting on 01/26/2025 for Transitions Of Care (Pt states he had the flu shot 3 mths ago and states his arm is still sore.)  Previous PCP: Jimmy  Last CPX: 06/09/2023  AMW 12/31/2024  Here for transfer of care and due for CPX.    Of note arm slight sore still after flu vaccine months ago.  BPH: Notes frequency at night and day.. not bothersome enough for a medciation.  Elevated Cholesterol:  Not on any medication Lab Results  Component Value Date   CHOL 232 (H) 06/09/2023   HDL 57.60 06/09/2023   LDLCALC 154 (H) 06/09/2023   TRIG 100.0 06/09/2023   CHOLHDL 4 06/09/2023  Using medications without problems: Muscle aches:  Diet compliance: Exercise: minimal Other complaints:    GERD:rare occurrence.. not using any medication to treat.      Relevant past medical, surgical, family and social history reviewed and updated as indicated. Interim medical history since our last visit reviewed. Allergies and medications reviewed and updated. No outpatient medications prior to visit.   No facility-administered medications prior to visit.     Per HPI unless specifically indicated in ROS section below Review of Systems  Constitutional:  Negative for fatigue and fever.  HENT:  Negative for ear pain.   Eyes:  Negative for pain.  Respiratory:  Negative for cough and shortness of breath.   Cardiovascular:  Negative for chest pain, palpitations and leg swelling.  Gastrointestinal:  Negative for abdominal pain.   Genitourinary:  Negative for dysuria.  Musculoskeletal:  Negative for arthralgias.  Neurological:  Negative for syncope, light-headedness and headaches.  Psychiatric/Behavioral:  Negative for dysphoric mood.    Objective:  BP 110/82   Pulse 68   Temp 97.8 F (36.6 C) (Oral)   Ht 5' 10.5 (1.791 m)   Wt 205 lb 8 oz (93.2 kg)   SpO2 97%   BMI 29.07 kg/m   Wt Readings from Last 3 Encounters:  01/26/25 205 lb 8 oz (93.2 kg)  12/31/24 204 lb 12.8 oz (92.9 kg)  10/24/23 205 lb 4 oz (93.1 kg)      Physical Exam Vitals reviewed.  Constitutional:      Appearance: He is well-developed.  HENT:     Head: Normocephalic.     Right Ear: Hearing normal.     Left Ear: Hearing normal.     Nose: Nose normal.  Neck:     Thyroid: No thyroid mass or thyromegaly.     Vascular: No carotid bruit.     Trachea: Trachea normal.  Cardiovascular:     Rate and Rhythm: Normal rate and regular rhythm.     Pulses: Normal pulses.     Heart sounds: Heart sounds not distant. No murmur heard.    No friction rub. No gallop.     Comments:  No peripheral edema Pulmonary:     Effort: Pulmonary effort is normal. No respiratory distress.     Breath sounds: Normal breath sounds.  Skin:    General: Skin is warm and dry.     Findings: No rash.  Psychiatric:        Speech: Speech normal.        Behavior: Behavior normal.        Thought Content: Thought content normal.       Results for orders placed or performed in visit on 06/09/23  CBC   Collection Time: 06/09/23  2:46 PM  Result Value Ref Range   WBC 8.1 4.0 - 10.5 K/uL   RBC 5.00 4.22 - 5.81 Mil/uL   Platelets 260.0 150.0 - 400.0 K/uL   Hemoglobin 15.4 13.0 - 17.0 g/dL   HCT 53.6 60.9 - 47.9 %   MCV 92.6 78.0 - 100.0 fl   MCHC 33.3 30.0 - 36.0 g/dL   RDW 85.6 88.4 - 84.4 %  Lipid panel   Collection Time: 06/09/23  2:46 PM  Result Value Ref Range   Cholesterol 232 (H) 0 - 200 mg/dL   Triglycerides 899.9 0.0 - 149.0 mg/dL   HDL 42.39 >60.99  mg/dL   VLDL 79.9 0.0 - 59.9 mg/dL   LDL Cholesterol 845 (H) 0 - 99 mg/dL   Total CHOL/HDL Ratio 4    NonHDL 174.14   Comprehensive metabolic panel   Collection Time: 06/09/23  2:46 PM  Result Value Ref Range   Sodium 139 135 - 145 mEq/L   Potassium 4.5 3.5 - 5.1 mEq/L   Chloride 103 96 - 112 mEq/L   CO2 29 19 - 32 mEq/L   Glucose, Bld 102 (H) 70 - 99 mg/dL   BUN 17 6 - 23 mg/dL   Creatinine, Ser 8.99 0.40 - 1.50 mg/dL   Total Bilirubin 0.7 0.2 - 1.2 mg/dL   Alkaline Phosphatase 46 39 - 117 U/L   AST 18 0 - 37 U/L   ALT 15 0 - 53 U/L   Total Protein 6.9 6.0 - 8.3 g/dL   Albumin 4.3 3.5 - 5.2 g/dL   GFR 25.81 >39.99 mL/min   Calcium 8.8 8.4 - 10.5 mg/dL    Assessment and Plan The patient's preventative maintenance and recommended screening tests for an annual wellness exam were reviewed in full today. Brought up to date unless services declined.  Counselled on the importance of diet, exercise, and its role in overall health and mortality. The patient's FH and SH was reviewed, including their home life, tobacco status, and drug and alcohol status.    Vaccines: consider shingrix, Tdap, COVID. Uptodate with flu, PCV20 Prostate Cancer Screen:not indicated given age Colon Cancer Screen: 05/2022 Danis.. plan possibly one more       Smoking Status:former remote small amount. ETOH/ drug use: 1 per day/ none  Hep C:  due  HIV screen:    Routine general medical examination at a health care facility  Hyperlipidemia, unspecified hyperlipidemia type Assessment & Plan:  Due for re-eval.  On no meds.  Orders: -     Lipid panel -     Comprehensive metabolic panel with GFR  Screening cholesterol level -     Hemoglobin A1c  Left inguinal hernia Assessment & Plan: S/P repair twice.   BPH with obstruction/lower urinary tract symptoms Assessment & Plan:  Chronic, not interested in medicaiton to treat.   Need for hepatitis C screening test -  Hepatitis C  antibody    Return in about 1 year (around 01/26/2026) for phone Medicare Wellness with Erminio, then fasting labs prior to Annual with Dr. Avelina.   Greig Avelina, MD  "

## 2025-01-26 NOTE — Assessment & Plan Note (Signed)
"   Chronic, not interested in medicaiton to treat. "

## 2025-01-26 NOTE — Assessment & Plan Note (Signed)
"   Due for re-eval.  On no meds. "

## 2025-01-27 ENCOUNTER — Ambulatory Visit: Payer: Self-pay | Admitting: Family Medicine

## 2025-01-27 LAB — HEPATITIS C ANTIBODY: Hepatitis C Ab: NONREACTIVE

## 2026-01-02 ENCOUNTER — Ambulatory Visit
